# Patient Record
Sex: Female | Born: 1945
Health system: Southern US, Community
[De-identification: ages and names within clinical notes are randomized; demographics above are authoritative.]

## PROBLEM LIST (undated history)

## (undated) DIAGNOSIS — E785 Hyperlipidemia, unspecified: Secondary | ICD-10-CM

## (undated) DIAGNOSIS — K219 Gastro-esophageal reflux disease without esophagitis: Secondary | ICD-10-CM

## (undated) DIAGNOSIS — I10 Essential (primary) hypertension: Secondary | ICD-10-CM

## (undated) DIAGNOSIS — R32 Unspecified urinary incontinence: Secondary | ICD-10-CM

## (undated) DIAGNOSIS — H903 Sensorineural hearing loss, bilateral: Secondary | ICD-10-CM

## (undated) DIAGNOSIS — N952 Postmenopausal atrophic vaginitis: Secondary | ICD-10-CM

## (undated) DIAGNOSIS — M3320 Polymyositis, organ involvement unspecified: Secondary | ICD-10-CM

## (undated) DIAGNOSIS — M1990 Unspecified osteoarthritis, unspecified site: Secondary | ICD-10-CM

## (undated) DIAGNOSIS — M51369 Other intervertebral disc degeneration, lumbar region without mention of lumbar back pain or lower extremity pain: Secondary | ICD-10-CM

## (undated) DIAGNOSIS — K449 Diaphragmatic hernia without obstruction or gangrene: Secondary | ICD-10-CM

## (undated) DIAGNOSIS — E890 Postprocedural hypothyroidism: Secondary | ICD-10-CM

## (undated) DIAGNOSIS — R413 Other amnesia: Secondary | ICD-10-CM

## (undated) DIAGNOSIS — R42 Dizziness and giddiness: Secondary | ICD-10-CM

## (undated) DIAGNOSIS — R1319 Other dysphagia: Secondary | ICD-10-CM

## (undated) DIAGNOSIS — J329 Chronic sinusitis, unspecified: Secondary | ICD-10-CM

## (undated) DIAGNOSIS — R197 Diarrhea, unspecified: Secondary | ICD-10-CM

## (undated) DIAGNOSIS — I459 Conduction disorder, unspecified: Secondary | ICD-10-CM

## (undated) DIAGNOSIS — I7300 Raynaud's syndrome without gangrene: Secondary | ICD-10-CM

## (undated) DIAGNOSIS — N3281 Overactive bladder: Secondary | ICD-10-CM

## (undated) DIAGNOSIS — R251 Tremor, unspecified: Secondary | ICD-10-CM

## (undated) DIAGNOSIS — K602 Anal fissure, unspecified: Secondary | ICD-10-CM

## (undated) DIAGNOSIS — I441 Atrioventricular block, second degree: Secondary | ICD-10-CM

## (undated) DIAGNOSIS — H8110 Benign paroxysmal vertigo, unspecified ear: Secondary | ICD-10-CM

## (undated) DIAGNOSIS — M5136 Other intervertebral disc degeneration, lumbar region: Secondary | ICD-10-CM

## (undated) DIAGNOSIS — K649 Unspecified hemorrhoids: Secondary | ICD-10-CM

## (undated) HISTORY — DX: Conduction disorder, unspecified: I45.9

## (undated) HISTORY — DX: Postmenopausal atrophic vaginitis: N95.2

## (undated) HISTORY — DX: Other amnesia: R41.3

## (undated) HISTORY — DX: Sensorineural hearing loss, bilateral: H90.3

## (undated) HISTORY — DX: Other intervertebral disc degeneration, lumbar region: M51.36

## (undated) HISTORY — DX: Anal fissure, unspecified: K60.2

## (undated) HISTORY — DX: Essential (primary) hypertension: I10

## (undated) HISTORY — DX: Hyperlipidemia, unspecified: E78.5

## (undated) HISTORY — DX: Dizziness and giddiness: R42

## (undated) HISTORY — DX: Atrioventricular block, second degree: I44.1

## (undated) HISTORY — PX: TUBAL LIGATION: SHX77

## (undated) HISTORY — DX: Raynaud's syndrome without gangrene: I73.00

## (undated) HISTORY — DX: Chronic sinusitis, unspecified: J32.9

## (undated) HISTORY — DX: Tremor, unspecified: R25.1

## (undated) HISTORY — DX: Polymyositis, organ involvement unspecified: M33.20

## (undated) HISTORY — DX: Postprocedural hypothyroidism: E89.0

## (undated) HISTORY — PX: CARPAL TUNNEL RELEASE: SHX101

## (undated) HISTORY — DX: Unspecified osteoarthritis, unspecified site: M19.90

## (undated) HISTORY — DX: Overactive bladder: N32.81

## (undated) HISTORY — DX: Other intervertebral disc degeneration, lumbar region without mention of lumbar back pain or lower extremity pain: M51.369

## (undated) HISTORY — DX: Diaphragmatic hernia without obstruction or gangrene: K44.9

## (undated) HISTORY — PX: THYROIDECTOMY: SHX17

## (undated) HISTORY — PX: CATARACT EXTRACTION, BILATERAL: SHX1313

## (undated) HISTORY — DX: Unspecified hemorrhoids: K64.9

## (undated) HISTORY — DX: Other dysphagia: R13.19

## (undated) HISTORY — DX: Diarrhea, unspecified: R19.7

## (undated) HISTORY — DX: Gastro-esophageal reflux disease without esophagitis: K21.9

## (undated) HISTORY — DX: Unspecified urinary incontinence: R32

## (undated) HISTORY — DX: Benign paroxysmal vertigo, unspecified ear: H81.10

## (undated) HISTORY — PX: TOTAL KNEE ARTHROPLASTY: SHX125

## (undated) HISTORY — PX: BILATERAL OOPHORECTOMY: SHX1221

---

## 2003-10-14 ENCOUNTER — Ambulatory Visit (HOSPITAL_BASED_OUTPATIENT_CLINIC_OR_DEPARTMENT_OTHER): Admission: RE | Admit: 2003-10-14 | Discharge: 2003-10-14 | Payer: Self-pay | Admitting: General Surgery

## 2003-10-14 ENCOUNTER — Encounter (INDEPENDENT_AMBULATORY_CARE_PROVIDER_SITE_OTHER): Payer: Self-pay | Admitting: *Deleted

## 2007-05-22 ENCOUNTER — Inpatient Hospital Stay (HOSPITAL_COMMUNITY): Admission: RE | Admit: 2007-05-22 | Discharge: 2007-05-25 | Payer: Self-pay | Admitting: Orthopedic Surgery

## 2008-02-14 ENCOUNTER — Inpatient Hospital Stay (HOSPITAL_COMMUNITY): Admission: RE | Admit: 2008-02-14 | Discharge: 2008-02-17 | Payer: Self-pay | Admitting: Orthopedic Surgery

## 2009-04-15 IMAGING — CR DG CHEST 2V
2 series · 2 of 2 positions shown · non-contrast
Comparison: none

CLINICAL DATA: Preop for left knee surgery.   
 CHEST ? 2 VIEW:

[w chest pa]
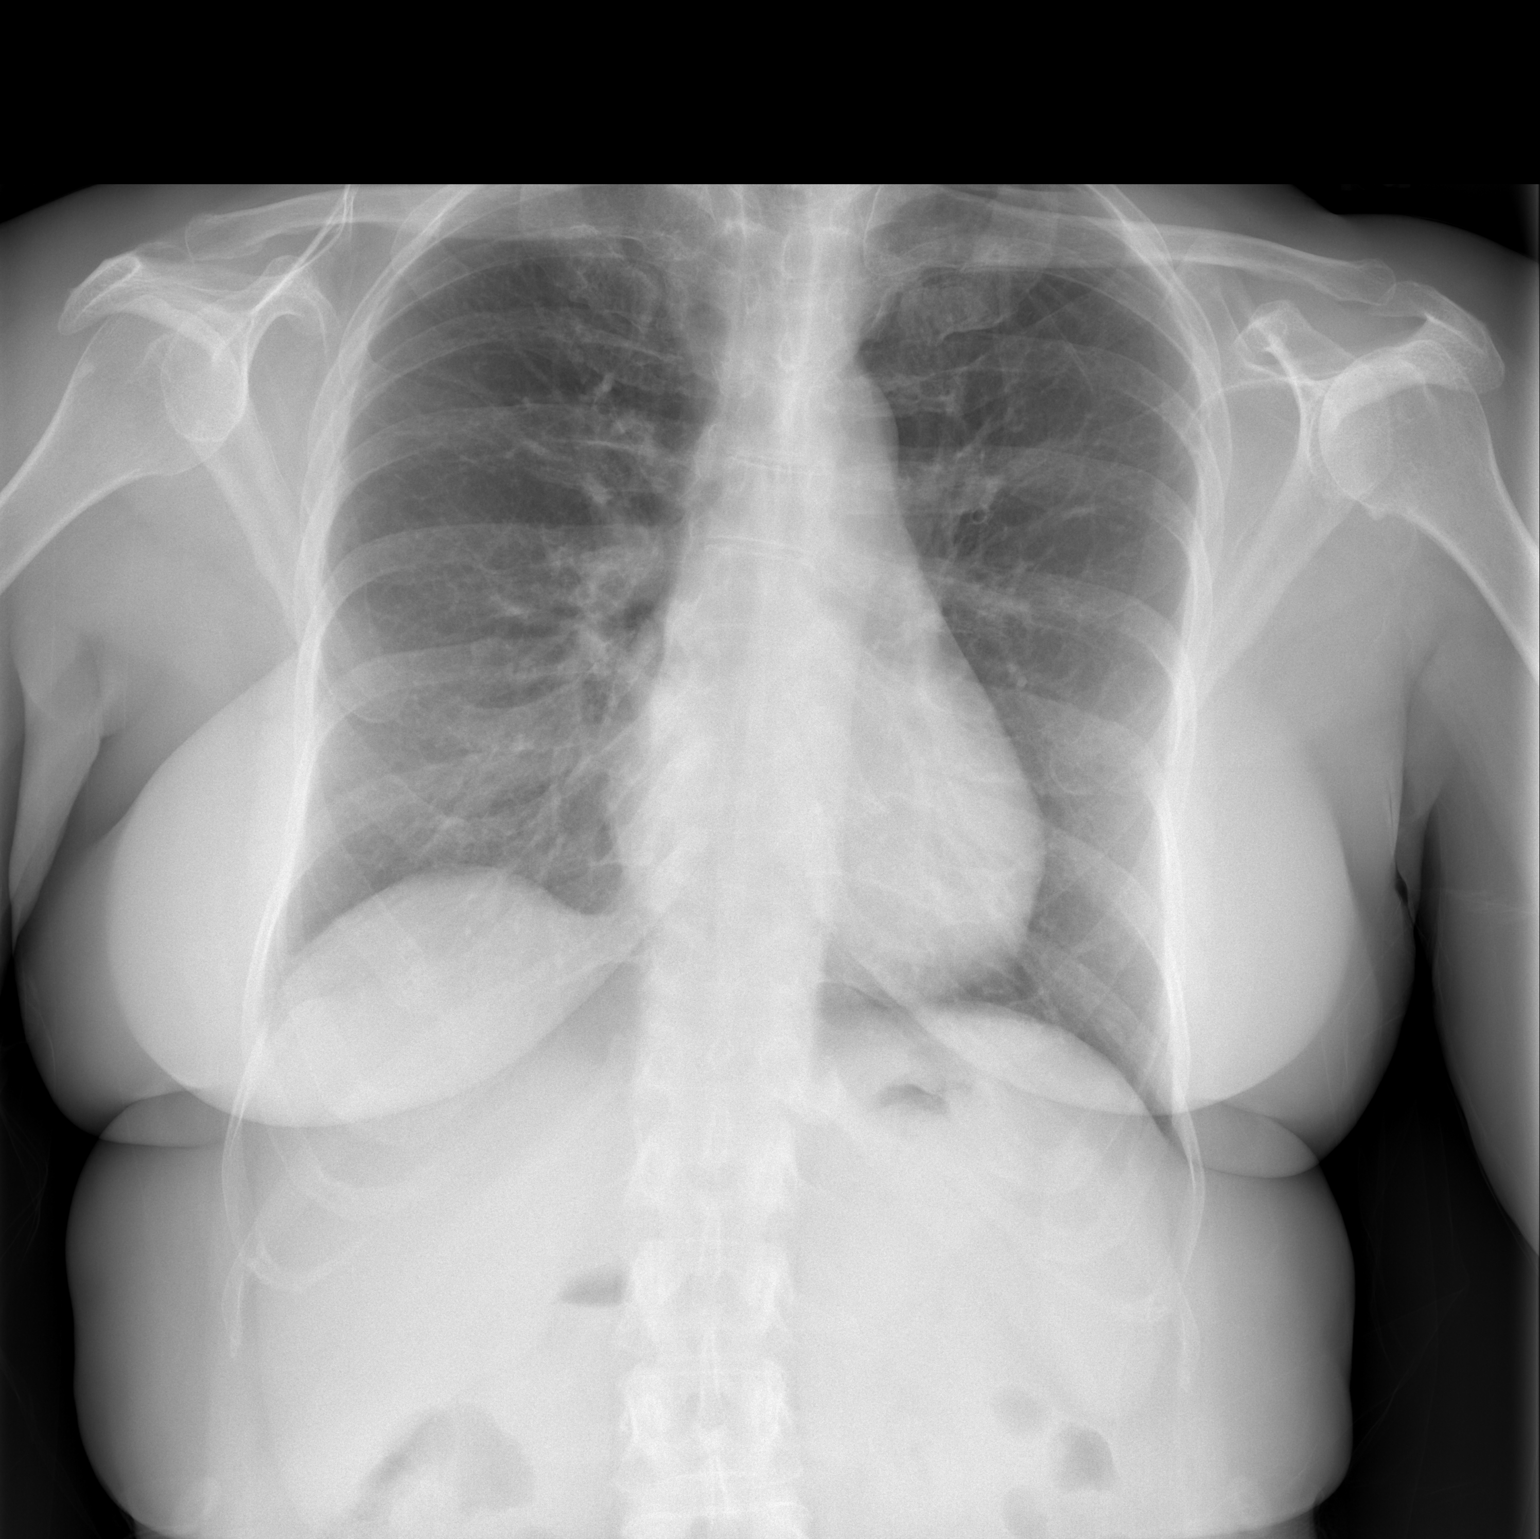

[w chest lat]
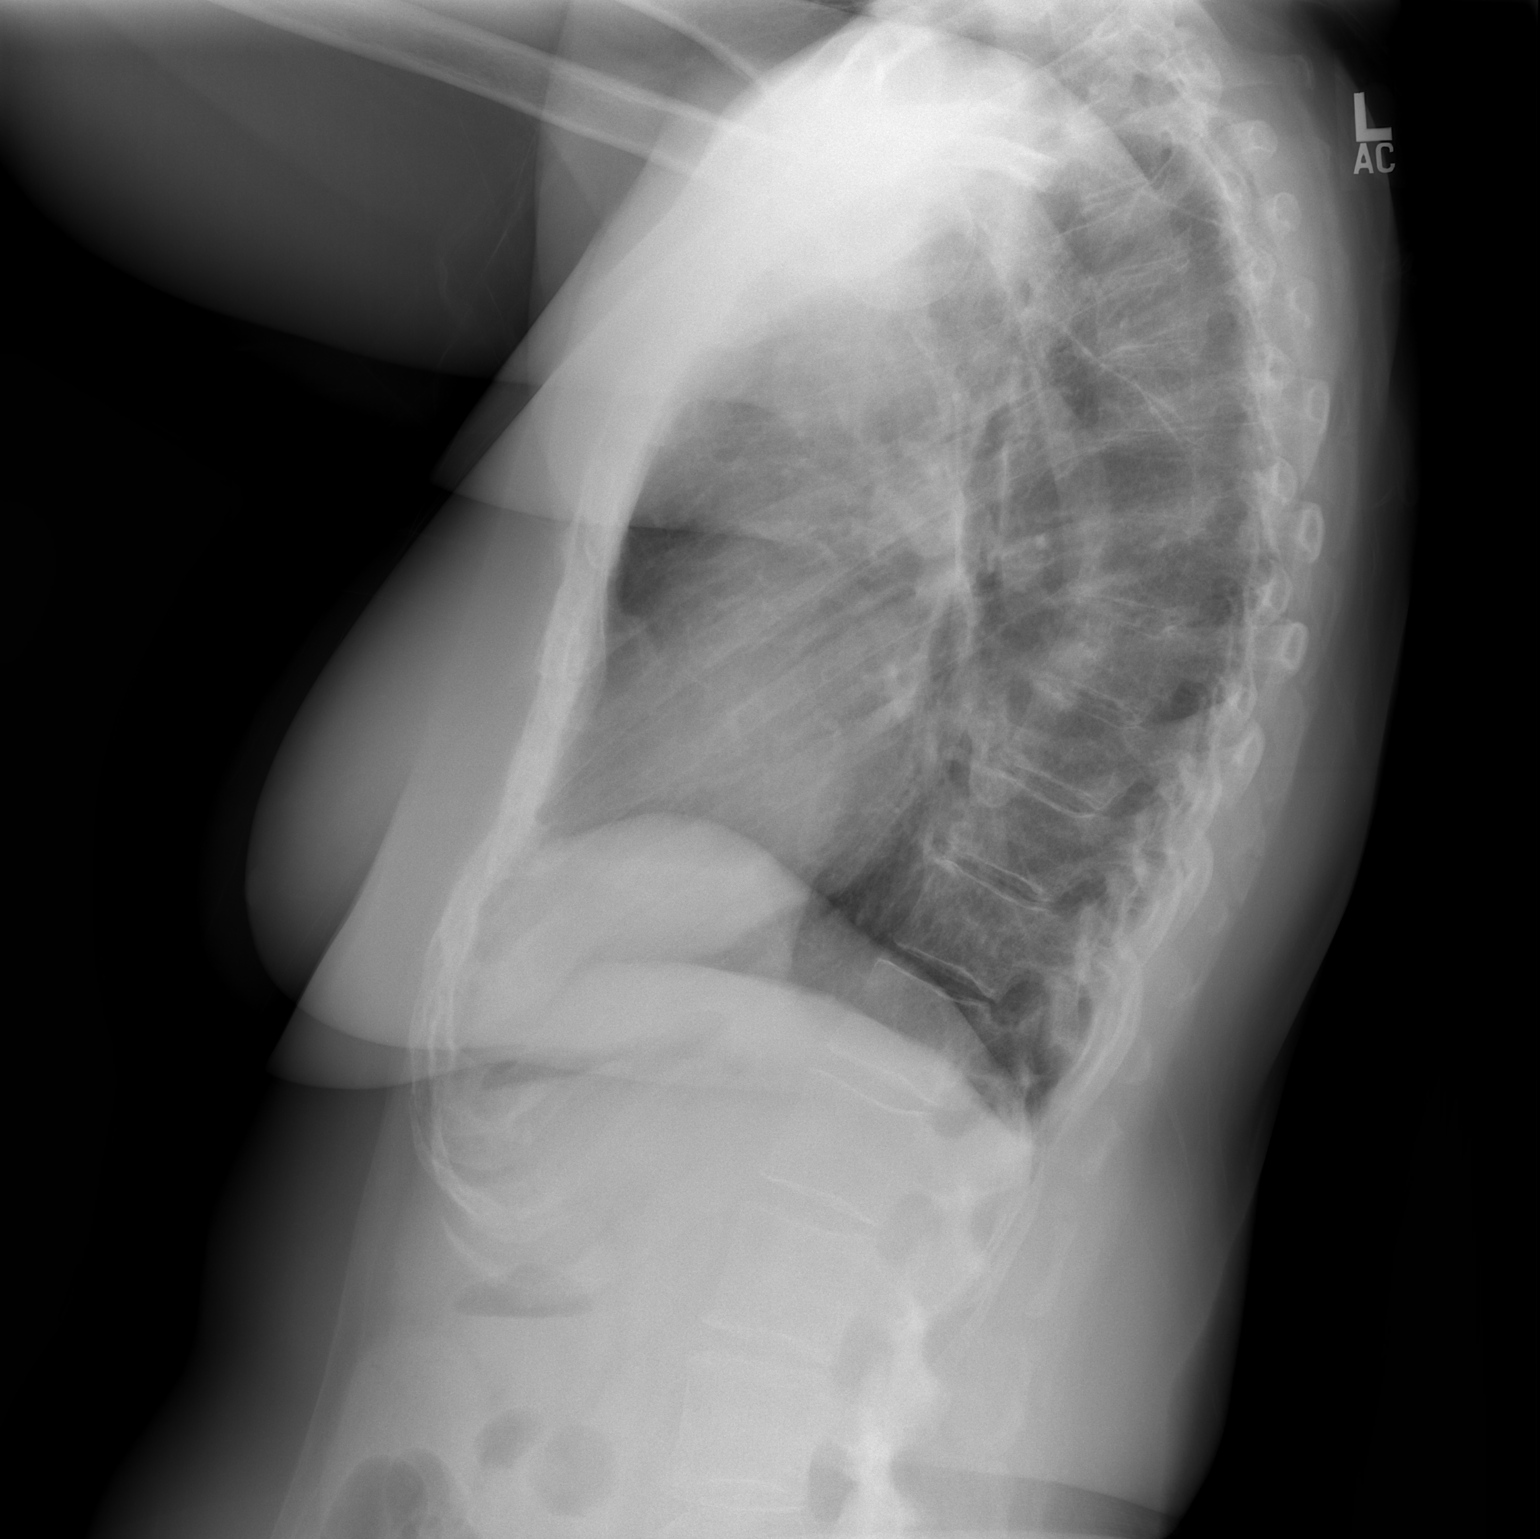

[2 of 2 positions shown; findings below may reference images not displayed]

FINDINGS: Two views of the chest show the lungs to be clear.  The heart is within normal limits in size.  No bony abnormality is noted.
IMPRESSION: No active lung disease.

## 2010-10-20 NOTE — Discharge Summary (Signed)
[name], [name]               ACCOUNT NO.:  [id number]   MEDICAL RECORD NO.:  [id number]          PATIENT TYPE:  INP   LOCATION:  1618                         FACILITY:  [facility]   PHYSICIAN:  Ollen Gross, M.D.    DATE OF BIRTH:  [birth year]   DATE OF ADMISSION:  05/22/2007  DATE OF DISCHARGE:  05/25/2007                               DISCHARGE SUMMARY   ADMISSION DIAGNOSES:  1. Osteoarthritis, left greater than right knee.  2. Hypothyroidism.  3. Hypertension.  4. Hyperlipidemia.  5. Polymyositis.  6. Recent acute sinusitis (treated preoperative).  7. Postmenopausal.   DISCHARGE DIAGNOSES:  1. Osteoarthritis, right knee and left knee, status post left total      knee arthroplasty and cortisone injection, right knee.  2. Mild postoperative blood loss anemia.  3. Mild postoperative hyponatremia, improved.  4. Hypothyroidism.  5. Hypertension.  6. Hyperlipidemia.  7. Polymyositis.  8. Recent acute sinusitis (treated preoperative).  9. Postmenopausal.   PROCEDURE:  Left total knee with cortisone injection in the right knee  on May 22, 2007.  Surgeon:  Dr. Lequita Halt.  Assistant:  Avel Peace  PA-C.  Anesthesia:  Done under spinal with Duramorph.   CONSULTATIONS:  None.   BRIEF HISTORY:  The patient is a 65 year old female with end-stage  arthritis of both knees, left more symptomatic than right, refractory to  nonoperative management, who now presents for a total knee.   LABORATORY DATA:  Preop CBC:  Hemoglobin 13.0, hematocrit 38.1, white  cell count 5.2.  Chem panel:  Slightly elevated sodium of 146.  Remaining Chem panel within normal limits.  PT/INR preop 13.2 and 1.0  with a PTT of 31.  Preop UA negative.  Serial CBCs followed.  Hemoglobin  did drop to 11.2.  last noted H&H 9.9 and 28.7.  Serial pro times  followed.  Last noted PT/INR 22.3 and 1.9.  Serial BMETs were followed.  Sodium did drop down to 134, came back up to 135, last noted at 137.  Remaining  electrolytes remained within normal limits.  EKG August 16, 2006:  Sinus rhythm, normal EKG.   X-RAYS:  Chest x-ray May 16, 2007:  No active lung disease.   HOSPITAL COURSE:  Patient admitted to [facility], tolerated  the procedure well, later transferred to the recovery room and  orthopedic floor.  Started on PCA and p.o. analgesic, given 24 hours of  postop IV antibiotics.  Was nauseated on the evening of surgery.  Was a  little bit better on the morning of day #1.  Decreased her fluids.  She  had good output.  Decreased the fluids once she had good p.o. intake,  started getting up out of bed.  By day #2 she was doing much better.  Nausea had resolved.  She started getting up with therapy, walking short  distances.  I discontinued her fluids and IVs.  Dressing change done.  Incision healing well.  By day #3 she was progressing with her therapy  and tolerating her medications and discharged home.   DISCHARGE PLAN:  1. Discharge May 25, 2007.  2. Discharge diagnoses:  Please see above.  3. Discharge medications:  Coumadin, Percocet, Robaxin.  4. Diet:  Low-sodium, heart-healthy diet.  5. Activity:  Weightbearing as tolerated, left lower extremity, total      knee protocol.  Home health PT and home health nursing.  6. Follow-up:  2 weeks.   DISPOSITION:  Home.   CONDITION ON DISCHARGE:  Improved.      Alexzandrew L. Perkins, P.A.C.      Ollen Gross, M.D.  Electronically Signed    ALP/MEDQ  D:  05/25/2007  T:  05/25/2007  Job:  [id number]

## 2010-10-20 NOTE — Op Note (Signed)
[name], [name]               ACCOUNT NO.:  [id number]   MEDICAL RECORD NO.:  [id number]          PATIENT TYPE:  INP   LOCATION:  0012                         FACILITY:  [facility]   PHYSICIAN:  Ollen Gross, M.D.    DATE OF BIRTH:  1945/12/16   DATE OF PROCEDURE:  02/14/2008  DATE OF DISCHARGE:                               OPERATIVE REPORT   PREOPERATIVE DIAGNOSIS:  Osteoarthritis right knee.   POSTOPERATIVE DIAGNOSIS:  Osteoarthritis right knee.   PROCEDURE:  Right total knee arthroplasty.   SURGEON:  Ollen Gross, M.D.   ASSISTANT:  Alexzandrew L. Perkins, P.A.C.   ANESTHESIA:  Spinal with Duramorph.   ESTIMATED BLOOD LOSS:  Minimal.   DRAINS:  None.   TOURNIQUET TIME:  40 minutes at 300 mmHg.   COMPLICATIONS:  None.   CONDITION:  Stable to recovery.   BRIEF CLINICAL NOTE:  [name] is a 65 year old female with end-stage  arthritis of the right knee with progressively worsening pain and  dysfunction.  She has had a recent successful left total knee  arthroplasty and presents now for right total knee arthroplasty.   PROCEDURE IN DETAIL:  After successful administration of spinal  anesthetic a tourniquet was placed high on the right thigh and right  lower extremity prepped and draped in the usual sterile fashion.  Extremity was wrapped in Esmarch, knee flexed and tourniquet inflated to  300 mmHg.  Midline incision was made with a 10 blade through  subcutaneous tissue to the level of the extensor mechanism.  A fresh  blade is used to make a medial parapatellar arthrotomy.  Soft tissue of  the proximal medial tibia is subperiosteally elevated to the joint line  with the knife and into the semimembranosus bursa with a Cobb elevator.  Soft tissue laterally is elevated with attention being paid to avoid the  patellar tendon on tibial tubercle.  The patella was subluxed laterally  and knee flexed 90 degrees.  ACL and PCL removed.  Drill was used to  create a starting  hole in the distal femur and the canal was thoroughly  irrigated.  A 5 degree right valgus alignment guide is placed and rest  of posterior condyles, rotation is marked and the block pinned to remove  11 mm off the distal femur.  Distal femoral resection is made with an  oscillating saw.  Sizing block was placed.  Size 3 is the most  appropriate.  Rotation is marked off the epicondylar axis.  The size 3  cutting block is placed and the anterior posterior chamfer cuts were  made.   The tibia was subluxed forward and menisci removed.  The extramedullary  tibial alignment guide is placed referencing proximally at the medial  aspect of the tibial tubercle and distally along the second metatarsal  axis and tibial crest.  The block was pinned to remove about 4 mm off  the more deficient lateral side.  Tibial resection is made with an  oscillating saw.  Size 3 is the most appropriate tibial component and  the proximal tibia prepared with a modular drill and keel  punch for a  size 3.  Femoral preparation is completed with intercondylar cut.   Size 3 mobile bearing tibial trial and size 3 posterior stabilized  femoral trial and a 10 mm posterior stabilized rotating platform insert  trial are placed.  With the 10 full extension is achieved with excellent  varus and valgus, anterior and posterior balance throughout full range  of motion.  The patella was everted and this was measured to be 21 mm.  Freehand resection was taken to 12 mm, a 35 template is placed, lug  holes were drilled, trial patella was placed and it tracks normally.  Osteophytes removed off the posterior femur with trial in place.  All  trials are removed and the cut bone surfaces are prepared with pulsatile  lavage.  Cement was mixed and once ready for implantation the size 3  mobile bearing tibial tray, size 3 posterior stabilized femur and 35  patella are cemented into place.  The patella was held with a clamp.  Trial 10 mm  insert was placed, knee held in full extension and all  extruded cement removed.  Once the cement was fully hardened then the  permanent 10 mm posterior stabilized rotating platform insert is placed  in the tibial tray.  The wound was copiously irrigated with saline  solution and FloSeal injected on the posterior capsule, medial and  lateral gutters and suprapatellar area.  A moist sponge is placed and  the tourniquet released with a total time of 40 minutes.  The sponges  held for 2 minutes then removed.  Minimal bleeding is encountered.  That  which was encountered was stopped with electrocautery.  The wound was  again further irrigated with saline and the arthrotomy closed with  interrupted #1 PDS.  Flexion against gravity then 135 degrees.  Subcu  was closed with interrupted 2-0 Vicryl and subcuticular running 4-0  Monocryl.  The incision is cleaned and dried and Steri-Strips and a  bulky sterile dressing are applied.  She then was placed into a knee  immobilizer, awakened and transported to recovery in stable condition.      Ollen Gross, M.D.  Electronically Signed     FA/MEDQ  D:  02/14/2008  T:  02/15/2008  Job:  [id number]

## 2010-10-20 NOTE — Op Note (Signed)
[name], [name]               ACCOUNT NO.:  [id number]   MEDICAL RECORD NO.:  [id number]          PATIENT TYPE:  INP   LOCATION:  0003                         FACILITY:  [facility]   PHYSICIAN:  Ollen Gross, M.D.    DATE OF BIRTH:  1945/09/26   DATE OF PROCEDURE:  05/22/2007  DATE OF DISCHARGE:                               OPERATIVE REPORT   PREOPERATIVE DIAGNOSIS:  1. Osteoarthritis of left knee.  2. Osteoarthritis of the right knee.   POSTOPERATIVE DIAGNOSIS:  1. Osteoarthritis of left knee.  2. Osteoarthritis of the right knee.   PROCEDURE:  1. Left total knee arthroplasty.  2. Cortisone injection, right knee.   SURGEON:  Ollen Gross, M.D.   ASSISTANT:  Alexzandrew L. Perkins, P.A.C.   ANESTHESIA:  Spinal with Duramorph.   ESTIMATED BLOOD LOSS:  Minimal.   DRAINS:  None.   TOURNIQUET TIME:  45 minutes at 300 mmHg.   COMPLICATIONS:  None.   CONDITION.:  Stable to recovery.   CLINICAL NOTE:  [name] is a 65 year old female with end-stage  arthritis of the knees, left more symptomatic than the right, with  progressively worsening pain and dysfunction.  She has failed  nonoperative management and presents now for total knee arthroplasty.   PROCEDURE IN DETAIL:  After the successful administration of spinal  anesthetic, a tourniquet was placed high on the left thigh, and the left  lower extremity prepped and draped in the usual sterile fashion.  The  extremity was wrapped in Esmarch, the knee flexed and a tourniquet  inflated to 300 mmHg.  A midline incision made with a #10 blade through  subcutaneous tissue, to a level of the extensor mechanism.  A fresh  blade was used to make a medial parapatellar arthrotomy.  Soft tissue  over the proximal and medial tibia subperiosteally elevated the joint  line with the knife, and into the semimembranosus bursa with a Cobb  elevator.  Soft tissues laterally were elevated, with attention being  paid to avoid patellar  tendon on tibial tubercle.  The patella was  everted, knee flexed to 90 degrees.  The ACL and PCL were removed.  A  drill was used to create a starting hole in the distal femur, and the  canal was thoroughly irrigated.  The 5 degree left valgus alignment  guide was placed, and referencing of the posterior condyle rotation was  marked on the block, and pinned to remove 10 mm of the distal femur.  Distal femoral resection was made with an oscillating saw.  Sizing block  was placed, and a size 3 is most appropriate.  Rotations was marked at  the epicondylar axis, and a size 3 cutting block was placed.  The  anterior, posterior and chamfer cuts were made.   The tibia was subluxed forward and the menisci are removed.  Extramedullary tibial alignment guide was placed, referencing proximally  at the medial aspect of the tibial tubercle and distally along the  second metatarsal axis of the tibial crest.  The block was pinned to  remove about 4 mm off the more deficient  medial side.  Tibial resection  was made with an oscillating saw.  A size 2.5 was the most appropriate  tibial component, and the proximal tibia was prepared with the modular  drill and keel punch for a size 2.5.  Femoral preparation was completed  with the intercondylar cut for the size 3.   A size 2.5 mobile bearing tibial trial, size 3 posterior stabilized  femoral trial, and a 10 mm posterior stabilized rotating platform insert  trial were placed.  With the 10 full extension was achieved, with  excellent varus and valgus balance throughout the full range of motion.  The patella was then everted and thickness measured to be 24 mm.  Freehand resection was taken to 14 mm, a 35 template was placed.  Lug  holes were drilled.  A trial patella was placed and it tracks normally.  Osteophytes were removed off the posterior femur with the trial in  place.  All trials were removed and the cut bone surfaces were prepared  with pulsatile  lavage.  Cement was mixed, and once ready for  implantation, a size 2.5 mobile bearing tibial tray, a size 3 posterior  stabilized femur and a 35 patella were cemented in place.  The patella  was held with the clamp.  A trial 10-mm insert was placed, and the knee  held in full extension; all extruded cement was removed.  When the  cement was fully hardened, then the wound was copiously irrigated with  saline solution.  The trial was  removed and FloSeal injected on the  posterior capsule.  A permanent 10 mm posterior stabilized rotating  platform insert was placed into the tibial tray.  The FloSeal was then  injected into the medial and lateral gutters and the suprapatellar area.  The tourniquet was released, with a total time of 45 minutes.  A moist  sponge was placed and the tourniquet was released.  Sponge was held for  about 2 minutes and removed.  Minimal bleeding was encountered.  That  which was encountered was stopped with electrocautery.  The wound was  then further irrigated and the extensor mechanism was then closed with  interrupted #1 PDS.  Flexion against gravity was 135 degrees.  The  subcutaneous was closed with interrupted 2-0 Vicryl and subcuticular  running 4-0 Monocryl.  The incision was cleaned and dried, and Steri-  Strips and a bulky sterile dressing were applied.  She was then placed  into a knee immobilizer.  I then prepped the right knee and injected  with lidocaine and Depo-Medrol with no problems.  She was then awakened  and transported to recovery in stable condition.      Ollen Gross, M.D.  Electronically Signed     FA/MEDQ  D:  05/22/2007  T:  05/22/2007  Job:  [id number]

## 2010-10-20 NOTE — H&P (Signed)
[name], [name]               ACCOUNT NO.:  [id number]   MEDICAL RECORD NO.:  [id number]          PATIENT TYPE:  INP   LOCATION:  0003                         FACILITY:  [facility]   PHYSICIAN:  Ollen Gross, M.D.    DATE OF BIRTH:  [birth year]   DATE OF ADMISSION:  05/22/2007  DATE OF DISCHARGE:                              HISTORY & PHYSICAL   CHIEF COMPLAINT:  Left knee pain.   HISTORY OF PRESENT ILLNESS:  The patient is a 65 year old female who has  been seen by Dr. Lequita Halt for bilateral knee pain, left greater than  right.  It has been ongoing for several years now.  She had a knee scope  in [place], [place], about eight years ago with some temporary  relief.  Unfortunately over time, her knees have become more painful and  problematic.  She is seen in the office where x-rays show the right knee  is just about bone-on-bone, left knee has end-stage disease and gone  into a little bit of valgus.  It is felt she has reached a point where  she could benefit from undergoing surgical intervention.  Risks and  benefits discussed.  The patient is subsequently admitted to the  hospital.   ALLERGIES:  NO KNOWN DRUG ALLERGIES.   CURRENT MEDICATIONS:  Methotrexate, levothyroxine, folic acid, atenolol,  ibuprofen, Extra Strength Tylenol, fiber laxative, One A Day vitamins,  Calcium plus D.   PAST MEDICAL HISTORY:  1. Hypertension.  2. Hypothyroidism.  3. Hyperlipidemia.  4. Polymyositis.  5. Osteoarthritis.  6. Recent acute sinusitis.  7. Postmenopausal.   PAST SURGICAL HISTORY:  1. Thyroid surgery.  2. Right knee scope.  3. Bilateral carpal tunnel releases.   FAMILY HISTORY:  Father deceased in his 30s, mother deceased at 78 with  Alzheimer's, pneumonia.   SOCIAL HISTORY:  Married, homemaker, nonsmoker.  No alcohol.  Three  children.  One of her daughters is a Engineer, civil (consulting).   REVIEW OF SYSTEMS:  GENERAL:  No fevers, chills, night sweats.  NEURO:  No seizures, syncope or  paralysis.  RESPIRATORY:  No shortness of  breath, productive cough or hemoptysis.  CARDIOVASCULAR:  No chest pain,  angina or orthopnea.  GI: No nausea, vomiting, diarrhea, constipation.  GU: No dysuria, hematuria or discharge.  MUSCULOSKELETAL: Left knee.   PHYSICAL EXAMINATION:  VITAL SIGNS:  Pulse 60, respirations 12, blood  pressure 160/82.  GENERAL:  A 65 year old white female well-nourished, well-developed, hip  and thigh obesity, slightly overweight.  Noted to wear glasses,  accompanied by her daughter.  HEENT:  Normocephalic, atraumatic.  Pupils round and reactive.  Noted to  wear glasses.  EOMs intact.  NECK:  Supple.  CHEST:  Clear anterior and posterior chest walls.  No rhonchi, rales or  wheezing.  HEART:  Regular rate and rhythm.  No murmur, S1-S2 noted.  ABDOMEN:  Soft, round.  Bowel sounds present.  RECTAL/BREASTS/GENITALIA:  Not done, not pertinent to present illness.  EXTREMITIES:  Left knee:  Left knee range of motion of 5-115, marked  crepitus, tender more lateral than medial.  No instability.  Right  knee:  0-120, marked crepitus, tender more medial than lateral.  No  instability.   IMPRESSION:  1. Osteoarthritis, left knee greater than right knee.  2. Hypothyroidism.  3. Hypertension  4. Hyperlipidemia.  5. Polymyositis.  6. Recent acute sinusitis (treated preoperatively).  7. Postmenopausal.   PLAN:  The patient admitted to [facility] to undergo a left  total knee replacement arthroplasty.  Surgery will be performed by Dr.  Ollen Gross.      Alexzandrew L. Perkins, P.A.C.      Ollen Gross, M.D.  Electronically Signed    ALP/MEDQ  D:  05/21/2007  T:  05/22/2007  Job:  [id number]   cc:   Gladstone Lighter [id number], M.D.  [facility]  [address]  [place], [place] [address]

## 2010-10-23 NOTE — Op Note (Signed)
NAME:  [name], [name]                         ACCOUNT NO.:  [id number]   MEDICAL RECORD NO.:  [id number]                   PATIENT TYPE:  AMB   LOCATION:  DSC                                  FACILITY:  [facility]   PHYSICIAN:  Gita Kudo, M.D.              DATE OF BIRTH:  01-18-1946   DATE OF PROCEDURE:  10/14/2003  DATE OF DISCHARGE:                                 OPERATIVE REPORT   OPERATIVE PROCEDURE:  Left deltoid muscle biopsy.   SURGEON:  Gita Kudo, M.D.   ANESTHESIA:  1% local Xylocaine.   CLINICAL SUMMARY:  A 65 year old female with possible myositis brought in  for elective muscle biopsy.   DESCRIPTION OF PROCEDURE:  She was positioned, prepped, and draped in the  standard fashion.  Infiltrated with Xylocaine.  A vertical incision made  centered over the upper deltoid and self-retaining retractor placed.  The  fascia was opened and the deltoid muscle grasped with an Allis clamp.  A  large segment was removed for good pathologic diagnosis.  Following this,  the wound was closed with figure-of-eight 3-0 Vicryl for the fascia,  interrupted 4-0 nylon for skin.  Sterile absorbent dressings were applied.  The patient tolerated the procedure well and will be discharged and be  followed as an outpatient.                                               Gita Kudo, M.D.    MRL/MEDQ  D:  10/14/2003  T:  10/14/2003  Job:  [id number]

## 2010-10-23 NOTE — Discharge Summary (Signed)
[name], [name]               ACCOUNT NO.:  [id number]   MEDICAL RECORD NO.:  [id number]          PATIENT TYPE:  INP   LOCATION:  1618                         FACILITY:  [facility]   PHYSICIAN:  Ollen Gross, M.D.    DATE OF BIRTH:  08-11-1945   DATE OF ADMISSION:  02/14/2008  DATE OF DISCHARGE:  02/17/2008                               DISCHARGE SUMMARY   ADMISSION DIAGNOSES:  1. Osteoarthritis, left knee.  2. History of bronchitis.  3. Hypertension.  4. Postmenopausal.  5. Hypothyroidism.  6. Hyperlipidemia.  7. History of polymyositis.   DISCHARGE DIAGNOSES:  1. Osteoarthritis, right knee, status post right total knee      replacement arthroplasty.  2. Mild postop blood loss anemia.  Did not require transfusion.  3. History of bronchitis.  4. Hypertension.  5. Postmenopausal.  6. Hypothyroidism.  7. Hyperlipidemia.  8. History of polymyositis.   PROCEDURE:  February 14, 2007, right total knee.   SURGEON:  Dr. Lequita Halt.   ASSISTANT:  Alexzandrew L. Perkins, PA-C.   ANESTHESIA:  Spinal.   CONSULTATIONS:  None.   BRIEF HISTORY:  Ms. Hitchman is a 64 year old female with end-stage  arthritis of the right knee with progressive worsening pain and  dysfunction who has had successful left total knee, now presents for  right total knee.   LABORATORY DATA:  Preop CBC showed a hemoglobin of 13.0, hematocrit  40.4, white cell count 5.1, platelets 197.  Postop hemoglobin 10.3, came  back up to 10.5 last night.  H and H 9.7 and 29.7.  PT/PTT preop 13.4  and 33 respectively.  INR 1.0.  Serial pro times followed, last noted  PT/INR 20.5 and 1.7.  Chem panel on admission all within normal limits.  Serial BMETs were followed.  Electrolytes remained within normal limits.  Glucose did go up from 75 to 157 and was coming back down to 148.  Preop  UA was negative.  Blood group type A negative.  EKG faxed over dated  August 16, 2007, normal EKG confirmed but unable to read  signature.   HOSPITAL COURSE:  The patient was admitted to [facility].  Tolerated procedure well.  Later transferred from the recovery room to  the orthopedic floor.  Started on PCA and p.o. analgesic pain control  following surgery.  She was able to get a little bit of rest off and on  through the night.  Was doing pretty well on the morning of day one.  Blood pressure was up a little bit.  They started her back on her  atenolol, encouraged pulmonary toilet due to her past history of recent  bronchitis.  Hemoglobin was stable.  Started getting up out of bed.  Actually did very well on day one, walked about 50 feet.  By day 2  continued to progress well.  Dressing was changed.  Incision looked  good.  Pain was under better control.  Hemoglobin was stable at 10.  Continued to do well with therapy, and by the morning of February 17, 2008, she was tolerating her meds and discharged home.  DISCHARGE PLAN:  1. The patient was discharged home on February 17, 2008.  2. Discharge diagnoses:  Please see above.  3. Discharge medications:  Percocet, Robaxin, Coumadin.   DIET:  Heart-healthy diet.   ACTIVITY:  Weightbearing as tolerated.  Total knee protocol.  Home  health PT, home health nursing.   FOLLOWUP:  Two weeks.   DISPOSITION:  Home.   CONDITION ON DISCHARGE:  Improving.      Alexzandrew L. Perkins, P.A.C.      Ollen Gross, M.D.  Electronically Signed    ALP/MEDQ  D:  03/19/2008  T:  03/19/2008  Job:  [id number]   cc:   Lemmie Evens, M.D.  Fax: [phone]   Dr. Mariane Baumgarten

## 2010-10-23 NOTE — H&P (Signed)
[name], [name]               ACCOUNT NO.:  [id number]   MEDICAL RECORD NO.:  [id number]          PATIENT TYPE:  INP   LOCATION:  NA                           FACILITY:  [facility]   PHYSICIAN:  Ollen Gross, M.D.    DATE OF BIRTH:  1945/09/24   DATE OF ADMISSION:  02/14/2008  DATE OF DISCHARGE:                              HISTORY & PHYSICAL   CHIEF COMPLAINT:  Right knee pain.   HISTORY OF PRESENT ILLNESS:  The patient is a 65 year old female who has  been seen by Dr. Lequita Halt for ongoing knee pain.  She has undergone a  left total knee in the past.  Unfortunately the right knee continues to  be problematic.  She is noted to have tricompartmental arthritis with  bone-on-bone throughout.  Is felt to be a good candidate.  Risks and  benefits have  been discussed, and she elected to proceed with surgery.   ALLERGIES:  No known drug allergies.   CURRENT MEDICATIONS:  Folic acid, methotrexate, levothyroxine, atenolol,  ibuprofen, Tylenol, FiberCon, Women's One A Day, calcium.   PAST MEDICAL HISTORY:  1. History of bronchitis.  2. Hypertension.  3. Postmenopausal.  4. Hypothyroidism.  5. Hyperlipidemia.  6. History of polymyositis.   PAST SURGICAL HISTORY:  1. Left knee surgery.  2. Thyroid surgery.  3. Bilateral carpal tunnel surgery.   SOCIAL HISTORY:  Married, nonsmoker.  No alcohol.  Three children.  Husband and children will be assisting with care after surgery.   FAMILY HISTORY:  Father deceased at age 35.  Mother deceased at age 46.  She has 1 sibling.   REVIEW OF SYSTEMS:  GENERAL:  No fever, chills or night sweats.  NEUROLOGICAL:  No seizures, syncope or paralysis.  RESPIRATORY:  No  shortness of breath, productive cough or hemoptysis.  CARDIOVASCULAR:  No chest pain, angina, orthopnea.  GI:  No nausea, vomiting, diarrhea or  constipation.  GU:  No dysuria, hematuria or discharge.  MUSCULOSKELETAL:  Joint pain.   PHYSICAL EXAMINATION:  VITAL SIGNS:  Pulse 64,  respirations 12, blood  pressure 138/88.  GENERAL:  A 62-year white female well-nourished, well-developed, hip and  thigh obesity, slightly overweight, no acute distress, alert, oriented,  and cooperative.  HEENT:  Normocephalic, atraumatic.  Pupils are round and reactive.  Oropharynx clear.  EOMs intact.  NECK:  Supple.  CHEST:  Clear.  HEART:  Regular rate and rhythm.  No murmur.  ABDOMEN:  Soft, slightly round.  Bowel sounds present.  RECTAL/BREASTS/GENITALIA:  Not done and not pertinent to present  illness.  EXTREMITIES:  Right knee range of motion 5-120.  No instability. She has  no effusion.   IMPRESSION:  Osteoarthritis right knee.   PLAN:  The patient admitted to [facility] to undergo a right  total knee replacement arthroplasty.  Surgery will be performed by Dr.  Ollen Gross.      Alexzandrew L. Perkins, P.A.C.      Ollen Gross, M.D.  Electronically Signed    ALP/MEDQ  D:  02/13/2008  T:  02/14/2008  Job:  [id number]   cc:  Lemmie Evens, M.D.  Fax: [phone]   Greg Dr. Tomasa Blase

## 2011-03-10 LAB — BASIC METABOLIC PANEL
BUN: 4 — ABNORMAL LOW
CO2: 28
Calcium: 8.1 — ABNORMAL LOW
Calcium: 8.3 — ABNORMAL LOW
Chloride: 105
Creatinine, Ser: 0.49
GFR calc Af Amer: >60
GFR calc non Af Amer: >60
Potassium: 4
Sodium: 136

## 2011-03-10 LAB — COMPREHENSIVE METABOLIC PANEL
Alkaline Phosphatase: 59
CO2: 28
Chloride: 109
Creatinine, Ser: 0.54
Potassium: 4.2
Sodium: 141

## 2011-03-10 LAB — CBC
HCT: 31.8 — ABNORMAL LOW
HCT: 40.4
Hemoglobin: 10.3 — ABNORMAL LOW
Hemoglobin: 10.5 — ABNORMAL LOW
Hemoglobin: 9.7 — ABNORMAL LOW
MCHC: 32.9
MCHC: 33
MCV: 87.9
MCV: 88.6
Platelets: 132 — ABNORMAL LOW
Platelets: 197
RDW: 17.4 — ABNORMAL HIGH
RDW: 17.6 — ABNORMAL HIGH
WBC: 5.1
WBC: 6.2
WBC: 8.5

## 2011-03-10 LAB — URINALYSIS, ROUTINE W REFLEX MICROSCOPIC
Bilirubin Urine: NEGATIVE
Hgb urine dipstick: NEGATIVE
Ketones, ur: NEGATIVE
Protein, ur: NEGATIVE

## 2011-03-10 LAB — PROTIME-INR
INR: 1
INR: 1.1
INR: 1.3
INR: 1.7 — ABNORMAL HIGH
Prothrombin Time: 13.4
Prothrombin Time: 17 — ABNORMAL HIGH
Prothrombin Time: 20.5 — ABNORMAL HIGH

## 2011-03-10 LAB — TYPE AND SCREEN
ABO/RH(D): A NEG
Antibody Screen: NEGATIVE

## 2011-03-12 LAB — BASIC METABOLIC PANEL
BUN: 10
CO2: 28
Calcium: 8.3 — ABNORMAL LOW
Chloride: 101
Chloride: 102
Creatinine, Ser: 0.55
GFR calc non Af Amer: >60
GFR calc non Af Amer: >60
GFR calc non Af Amer: >60
Potassium: 4.3
Potassium: 4.4
Sodium: 134 — ABNORMAL LOW
Sodium: 135
Sodium: 137

## 2011-03-12 LAB — CBC
HCT: 28.7 — ABNORMAL LOW
HCT: 31.1 — ABNORMAL LOW
HCT: 32 — ABNORMAL LOW
Hemoglobin: 10.6 — ABNORMAL LOW
Hemoglobin: 11.2 — ABNORMAL LOW
MCHC: 34.2
MCHC: 34.4
MCHC: 34.9
MCV: 88.9
MCV: 89.4
Platelets: 167
Platelets: 173
Platelets: 201
RBC: 3.47 — ABNORMAL LOW
RBC: 3.6 — ABNORMAL LOW
RDW: 15.4
RDW: 15.9 — ABNORMAL HIGH
WBC: 8.6
WBC: 8.7

## 2011-03-12 LAB — TYPE AND SCREEN
ABO/RH(D): A NEG
Antibody Screen: NEGATIVE

## 2011-03-12 LAB — PROTIME-INR
INR: 1.9 — ABNORMAL HIGH
Prothrombin Time: 13.1

## 2011-03-12 LAB — ABO/RH: ABO/RH(D): A NEG

## 2011-03-15 LAB — URINALYSIS, ROUTINE W REFLEX MICROSCOPIC
Bilirubin Urine: NEGATIVE
Glucose, UA: NEGATIVE
Hgb urine dipstick: NEGATIVE
Ketones, ur: NEGATIVE
Nitrite: NEGATIVE
Protein, ur: NEGATIVE
Specific Gravity, Urine: 1.021
Urobilinogen, UA: 0.2
pH: 6

## 2011-03-15 LAB — COMPREHENSIVE METABOLIC PANEL
ALT: 25
AST: 30
Albumin: 3.5
Alkaline Phosphatase: 65
BUN: 7
CO2: 30
Calcium: 9.4
Chloride: 108
Creatinine, Ser: 0.67
GFR calc Af Amer: >60
GFR calc non Af Amer: >60
Glucose, Bld: 100 — ABNORMAL HIGH
Potassium: 4.2
Sodium: 146 — ABNORMAL HIGH
Total Bilirubin: 1
Total Protein: 5.9 — ABNORMAL LOW

## 2011-03-15 LAB — CBC
HCT: 38.1
Hemoglobin: 13
MCHC: 34.1
MCV: 88.6
Platelets: 241
RBC: 4.3
RDW: 15.1
WBC: 5.2

## 2011-03-15 LAB — PROTIME-INR
INR: 1
Prothrombin Time: 13.2

## 2011-03-15 LAB — APTT: aPTT: 31

## 2011-06-10 DIAGNOSIS — M1710 Unilateral primary osteoarthritis, unspecified knee: Secondary | ICD-10-CM | POA: Diagnosis not present

## 2011-06-16 DIAGNOSIS — Z6834 Body mass index (BMI) 34.0-34.9, adult: Secondary | ICD-10-CM | POA: Diagnosis not present

## 2011-06-16 DIAGNOSIS — Z79899 Other long term (current) drug therapy: Secondary | ICD-10-CM | POA: Diagnosis not present

## 2011-06-16 DIAGNOSIS — I10 Essential (primary) hypertension: Secondary | ICD-10-CM | POA: Diagnosis not present

## 2011-06-16 DIAGNOSIS — E039 Hypothyroidism, unspecified: Secondary | ICD-10-CM | POA: Diagnosis not present

## 2011-06-16 DIAGNOSIS — M3320 Polymyositis, organ involvement unspecified: Secondary | ICD-10-CM | POA: Diagnosis not present

## 2011-08-03 DIAGNOSIS — M3320 Polymyositis, organ involvement unspecified: Secondary | ICD-10-CM | POA: Diagnosis not present

## 2011-08-03 DIAGNOSIS — M159 Polyosteoarthritis, unspecified: Secondary | ICD-10-CM | POA: Diagnosis not present

## 2011-09-29 DIAGNOSIS — Z79899 Other long term (current) drug therapy: Secondary | ICD-10-CM | POA: Diagnosis not present

## 2011-11-30 DIAGNOSIS — M3320 Polymyositis, organ involvement unspecified: Secondary | ICD-10-CM | POA: Diagnosis not present

## 2011-11-30 DIAGNOSIS — M159 Polyosteoarthritis, unspecified: Secondary | ICD-10-CM | POA: Diagnosis not present

## 2011-12-20 DIAGNOSIS — E039 Hypothyroidism, unspecified: Secondary | ICD-10-CM | POA: Diagnosis not present

## 2011-12-20 DIAGNOSIS — M3320 Polymyositis, organ involvement unspecified: Secondary | ICD-10-CM | POA: Diagnosis not present

## 2011-12-20 DIAGNOSIS — Z1231 Encounter for screening mammogram for malignant neoplasm of breast: Secondary | ICD-10-CM | POA: Diagnosis not present

## 2011-12-20 DIAGNOSIS — Z6833 Body mass index (BMI) 33.0-33.9, adult: Secondary | ICD-10-CM | POA: Diagnosis not present

## 2011-12-20 DIAGNOSIS — I10 Essential (primary) hypertension: Secondary | ICD-10-CM | POA: Diagnosis not present

## 2011-12-27 DIAGNOSIS — K921 Melena: Secondary | ICD-10-CM | POA: Diagnosis not present

## 2012-01-04 DIAGNOSIS — I10 Essential (primary) hypertension: Secondary | ICD-10-CM | POA: Diagnosis not present

## 2012-01-04 DIAGNOSIS — K921 Melena: Secondary | ICD-10-CM | POA: Diagnosis not present

## 2012-01-04 DIAGNOSIS — Z7982 Long term (current) use of aspirin: Secondary | ICD-10-CM | POA: Diagnosis not present

## 2012-01-04 DIAGNOSIS — D129 Benign neoplasm of anus and anal canal: Secondary | ICD-10-CM | POA: Diagnosis not present

## 2012-01-04 DIAGNOSIS — Z79899 Other long term (current) drug therapy: Secondary | ICD-10-CM | POA: Diagnosis not present

## 2012-01-04 DIAGNOSIS — D128 Benign neoplasm of rectum: Secondary | ICD-10-CM | POA: Diagnosis not present

## 2012-01-04 HISTORY — PX: COLONOSCOPY: SHX174

## 2012-02-15 DIAGNOSIS — I10 Essential (primary) hypertension: Secondary | ICD-10-CM | POA: Diagnosis not present

## 2012-02-15 DIAGNOSIS — R209 Unspecified disturbances of skin sensation: Secondary | ICD-10-CM | POA: Diagnosis not present

## 2012-02-15 DIAGNOSIS — E039 Hypothyroidism, unspecified: Secondary | ICD-10-CM | POA: Diagnosis not present

## 2012-02-15 DIAGNOSIS — R079 Chest pain, unspecified: Secondary | ICD-10-CM | POA: Diagnosis not present

## 2012-02-15 DIAGNOSIS — R404 Transient alteration of awareness: Secondary | ICD-10-CM | POA: Diagnosis not present

## 2012-02-15 DIAGNOSIS — H8110 Benign paroxysmal vertigo, unspecified ear: Secondary | ICD-10-CM | POA: Diagnosis not present

## 2012-02-15 DIAGNOSIS — R112 Nausea with vomiting, unspecified: Secondary | ICD-10-CM | POA: Diagnosis not present

## 2012-02-15 DIAGNOSIS — R42 Dizziness and giddiness: Secondary | ICD-10-CM | POA: Diagnosis not present

## 2012-02-16 DIAGNOSIS — K591 Functional diarrhea: Secondary | ICD-10-CM | POA: Diagnosis not present

## 2012-03-16 DIAGNOSIS — K921 Melena: Secondary | ICD-10-CM | POA: Diagnosis not present

## 2012-03-31 DIAGNOSIS — Z23 Encounter for immunization: Secondary | ICD-10-CM | POA: Diagnosis not present

## 2012-03-31 DIAGNOSIS — M3320 Polymyositis, organ involvement unspecified: Secondary | ICD-10-CM | POA: Diagnosis not present

## 2012-03-31 DIAGNOSIS — M159 Polyosteoarthritis, unspecified: Secondary | ICD-10-CM | POA: Diagnosis not present

## 2012-04-14 DIAGNOSIS — H8109 Meniere's disease, unspecified ear: Secondary | ICD-10-CM | POA: Diagnosis not present

## 2012-04-14 DIAGNOSIS — R42 Dizziness and giddiness: Secondary | ICD-10-CM | POA: Diagnosis not present

## 2012-04-14 DIAGNOSIS — H9319 Tinnitus, unspecified ear: Secondary | ICD-10-CM | POA: Diagnosis not present

## 2012-04-26 DIAGNOSIS — Z6833 Body mass index (BMI) 33.0-33.9, adult: Secondary | ICD-10-CM | POA: Diagnosis not present

## 2012-04-26 DIAGNOSIS — B029 Zoster without complications: Secondary | ICD-10-CM | POA: Diagnosis not present

## 2012-05-02 DIAGNOSIS — H905 Unspecified sensorineural hearing loss: Secondary | ICD-10-CM | POA: Diagnosis not present

## 2012-05-02 DIAGNOSIS — H903 Sensorineural hearing loss, bilateral: Secondary | ICD-10-CM | POA: Diagnosis not present

## 2012-05-02 DIAGNOSIS — H8110 Benign paroxysmal vertigo, unspecified ear: Secondary | ICD-10-CM | POA: Diagnosis not present

## 2012-05-02 DIAGNOSIS — H9319 Tinnitus, unspecified ear: Secondary | ICD-10-CM | POA: Diagnosis not present

## 2012-05-25 DIAGNOSIS — Z79899 Other long term (current) drug therapy: Secondary | ICD-10-CM | POA: Diagnosis not present

## 2012-05-25 DIAGNOSIS — E039 Hypothyroidism, unspecified: Secondary | ICD-10-CM | POA: Diagnosis not present

## 2012-06-14 DIAGNOSIS — K921 Melena: Secondary | ICD-10-CM | POA: Diagnosis not present

## 2012-06-14 DIAGNOSIS — H8110 Benign paroxysmal vertigo, unspecified ear: Secondary | ICD-10-CM | POA: Diagnosis not present

## 2012-06-23 DIAGNOSIS — R42 Dizziness and giddiness: Secondary | ICD-10-CM | POA: Diagnosis not present

## 2012-06-23 DIAGNOSIS — E039 Hypothyroidism, unspecified: Secondary | ICD-10-CM | POA: Diagnosis not present

## 2012-06-23 DIAGNOSIS — I10 Essential (primary) hypertension: Secondary | ICD-10-CM | POA: Diagnosis not present

## 2012-06-23 DIAGNOSIS — E785 Hyperlipidemia, unspecified: Secondary | ICD-10-CM | POA: Diagnosis not present

## 2012-06-23 DIAGNOSIS — Z6832 Body mass index (BMI) 32.0-32.9, adult: Secondary | ICD-10-CM | POA: Diagnosis not present

## 2012-06-28 DIAGNOSIS — H52229 Regular astigmatism, unspecified eye: Secondary | ICD-10-CM | POA: Diagnosis not present

## 2012-06-28 DIAGNOSIS — H5200 Hypermetropia, unspecified eye: Secondary | ICD-10-CM | POA: Diagnosis not present

## 2012-06-28 DIAGNOSIS — H43399 Other vitreous opacities, unspecified eye: Secondary | ICD-10-CM | POA: Diagnosis not present

## 2012-06-28 DIAGNOSIS — H2510 Age-related nuclear cataract, unspecified eye: Secondary | ICD-10-CM | POA: Diagnosis not present

## 2012-08-01 DIAGNOSIS — M3320 Polymyositis, organ involvement unspecified: Secondary | ICD-10-CM | POA: Diagnosis not present

## 2012-08-01 DIAGNOSIS — M159 Polyosteoarthritis, unspecified: Secondary | ICD-10-CM | POA: Diagnosis not present

## 2012-08-01 DIAGNOSIS — I7300 Raynaud's syndrome without gangrene: Secondary | ICD-10-CM | POA: Diagnosis not present

## 2012-09-14 DIAGNOSIS — L570 Actinic keratosis: Secondary | ICD-10-CM | POA: Diagnosis not present

## 2012-09-14 DIAGNOSIS — B351 Tinea unguium: Secondary | ICD-10-CM | POA: Diagnosis not present

## 2012-09-27 DIAGNOSIS — M3320 Polymyositis, organ involvement unspecified: Secondary | ICD-10-CM | POA: Diagnosis not present

## 2012-09-27 DIAGNOSIS — E039 Hypothyroidism, unspecified: Secondary | ICD-10-CM | POA: Diagnosis not present

## 2012-11-29 DIAGNOSIS — M159 Polyosteoarthritis, unspecified: Secondary | ICD-10-CM | POA: Diagnosis not present

## 2012-11-29 DIAGNOSIS — I7300 Raynaud's syndrome without gangrene: Secondary | ICD-10-CM | POA: Diagnosis not present

## 2012-11-29 DIAGNOSIS — M3320 Polymyositis, organ involvement unspecified: Secondary | ICD-10-CM | POA: Diagnosis not present

## 2012-12-21 DIAGNOSIS — L821 Other seborrheic keratosis: Secondary | ICD-10-CM | POA: Diagnosis not present

## 2012-12-21 DIAGNOSIS — L570 Actinic keratosis: Secondary | ICD-10-CM | POA: Diagnosis not present

## 2012-12-21 DIAGNOSIS — B351 Tinea unguium: Secondary | ICD-10-CM | POA: Diagnosis not present

## 2012-12-26 DIAGNOSIS — E785 Hyperlipidemia, unspecified: Secondary | ICD-10-CM | POA: Diagnosis not present

## 2012-12-26 DIAGNOSIS — Z1331 Encounter for screening for depression: Secondary | ICD-10-CM | POA: Diagnosis not present

## 2012-12-26 DIAGNOSIS — Z9181 History of falling: Secondary | ICD-10-CM | POA: Diagnosis not present

## 2012-12-26 DIAGNOSIS — M3320 Polymyositis, organ involvement unspecified: Secondary | ICD-10-CM | POA: Diagnosis not present

## 2012-12-26 DIAGNOSIS — E039 Hypothyroidism, unspecified: Secondary | ICD-10-CM | POA: Diagnosis not present

## 2012-12-26 DIAGNOSIS — I10 Essential (primary) hypertension: Secondary | ICD-10-CM | POA: Diagnosis not present

## 2012-12-26 DIAGNOSIS — Z6833 Body mass index (BMI) 33.0-33.9, adult: Secondary | ICD-10-CM | POA: Diagnosis not present

## 2013-01-16 DIAGNOSIS — Z1231 Encounter for screening mammogram for malignant neoplasm of breast: Secondary | ICD-10-CM | POA: Diagnosis not present

## 2013-01-24 DIAGNOSIS — Z79899 Other long term (current) drug therapy: Secondary | ICD-10-CM | POA: Diagnosis not present

## 2013-01-25 DIAGNOSIS — R928 Other abnormal and inconclusive findings on diagnostic imaging of breast: Secondary | ICD-10-CM | POA: Diagnosis not present

## 2013-03-28 DIAGNOSIS — IMO0002 Reserved for concepts with insufficient information to code with codable children: Secondary | ICD-10-CM | POA: Diagnosis not present

## 2013-03-28 DIAGNOSIS — M159 Polyosteoarthritis, unspecified: Secondary | ICD-10-CM | POA: Diagnosis not present

## 2013-03-28 DIAGNOSIS — M3320 Polymyositis, organ involvement unspecified: Secondary | ICD-10-CM | POA: Diagnosis not present

## 2013-03-28 DIAGNOSIS — I7300 Raynaud's syndrome without gangrene: Secondary | ICD-10-CM | POA: Diagnosis not present

## 2013-06-28 DIAGNOSIS — M3320 Polymyositis, organ involvement unspecified: Secondary | ICD-10-CM | POA: Diagnosis not present

## 2013-06-28 DIAGNOSIS — I7300 Raynaud's syndrome without gangrene: Secondary | ICD-10-CM | POA: Diagnosis not present

## 2013-06-28 DIAGNOSIS — M159 Polyosteoarthritis, unspecified: Secondary | ICD-10-CM | POA: Diagnosis not present

## 2013-06-28 DIAGNOSIS — IMO0002 Reserved for concepts with insufficient information to code with codable children: Secondary | ICD-10-CM | POA: Diagnosis not present

## 2013-07-03 DIAGNOSIS — E039 Hypothyroidism, unspecified: Secondary | ICD-10-CM | POA: Diagnosis not present

## 2013-07-03 DIAGNOSIS — Z23 Encounter for immunization: Secondary | ICD-10-CM | POA: Diagnosis not present

## 2013-07-03 DIAGNOSIS — I10 Essential (primary) hypertension: Secondary | ICD-10-CM | POA: Diagnosis not present

## 2013-07-03 DIAGNOSIS — E785 Hyperlipidemia, unspecified: Secondary | ICD-10-CM | POA: Diagnosis not present

## 2013-07-03 DIAGNOSIS — M3320 Polymyositis, organ involvement unspecified: Secondary | ICD-10-CM | POA: Diagnosis not present

## 2013-07-03 DIAGNOSIS — Z6834 Body mass index (BMI) 34.0-34.9, adult: Secondary | ICD-10-CM | POA: Diagnosis not present

## 2013-09-19 DIAGNOSIS — I10 Essential (primary) hypertension: Secondary | ICD-10-CM | POA: Diagnosis not present

## 2013-09-19 DIAGNOSIS — H2589 Other age-related cataract: Secondary | ICD-10-CM | POA: Diagnosis not present

## 2013-09-19 DIAGNOSIS — H5200 Hypermetropia, unspecified eye: Secondary | ICD-10-CM | POA: Diagnosis not present

## 2013-09-19 DIAGNOSIS — H52229 Regular astigmatism, unspecified eye: Secondary | ICD-10-CM | POA: Diagnosis not present

## 2013-09-28 DIAGNOSIS — IMO0002 Reserved for concepts with insufficient information to code with codable children: Secondary | ICD-10-CM | POA: Diagnosis not present

## 2013-09-28 DIAGNOSIS — M159 Polyosteoarthritis, unspecified: Secondary | ICD-10-CM | POA: Diagnosis not present

## 2013-09-28 DIAGNOSIS — I7300 Raynaud's syndrome without gangrene: Secondary | ICD-10-CM | POA: Diagnosis not present

## 2013-09-28 DIAGNOSIS — M3320 Polymyositis, organ involvement unspecified: Secondary | ICD-10-CM | POA: Diagnosis not present

## 2013-10-15 DIAGNOSIS — R350 Frequency of micturition: Secondary | ICD-10-CM | POA: Diagnosis not present

## 2013-10-15 DIAGNOSIS — Z6833 Body mass index (BMI) 33.0-33.9, adult: Secondary | ICD-10-CM | POA: Diagnosis not present

## 2013-10-15 DIAGNOSIS — N318 Other neuromuscular dysfunction of bladder: Secondary | ICD-10-CM | POA: Diagnosis not present

## 2013-12-28 DIAGNOSIS — M159 Polyosteoarthritis, unspecified: Secondary | ICD-10-CM | POA: Diagnosis not present

## 2013-12-28 DIAGNOSIS — I7300 Raynaud's syndrome without gangrene: Secondary | ICD-10-CM | POA: Diagnosis not present

## 2013-12-28 DIAGNOSIS — IMO0002 Reserved for concepts with insufficient information to code with codable children: Secondary | ICD-10-CM | POA: Diagnosis not present

## 2013-12-28 DIAGNOSIS — M3320 Polymyositis, organ involvement unspecified: Secondary | ICD-10-CM | POA: Diagnosis not present

## 2014-01-03 DIAGNOSIS — I10 Essential (primary) hypertension: Secondary | ICD-10-CM | POA: Diagnosis not present

## 2014-01-03 DIAGNOSIS — M3320 Polymyositis, organ involvement unspecified: Secondary | ICD-10-CM | POA: Diagnosis not present

## 2014-01-03 DIAGNOSIS — E890 Postprocedural hypothyroidism: Secondary | ICD-10-CM | POA: Diagnosis not present

## 2014-01-03 DIAGNOSIS — E785 Hyperlipidemia, unspecified: Secondary | ICD-10-CM | POA: Diagnosis not present

## 2014-02-01 DIAGNOSIS — Z1231 Encounter for screening mammogram for malignant neoplasm of breast: Secondary | ICD-10-CM | POA: Diagnosis not present

## 2014-02-01 DIAGNOSIS — Z13820 Encounter for screening for osteoporosis: Secondary | ICD-10-CM | POA: Diagnosis not present

## 2014-02-14 DIAGNOSIS — Z471 Aftercare following joint replacement surgery: Secondary | ICD-10-CM | POA: Diagnosis not present

## 2014-02-14 DIAGNOSIS — Z96659 Presence of unspecified artificial knee joint: Secondary | ICD-10-CM | POA: Diagnosis not present

## 2014-03-14 DIAGNOSIS — Z23 Encounter for immunization: Secondary | ICD-10-CM | POA: Diagnosis not present

## 2014-04-01 DIAGNOSIS — M3320 Polymyositis, organ involvement unspecified: Secondary | ICD-10-CM | POA: Diagnosis not present

## 2014-04-01 DIAGNOSIS — M150 Primary generalized (osteo)arthritis: Secondary | ICD-10-CM | POA: Diagnosis not present

## 2014-04-01 DIAGNOSIS — I7300 Raynaud's syndrome without gangrene: Secondary | ICD-10-CM | POA: Diagnosis not present

## 2014-04-01 DIAGNOSIS — M5136 Other intervertebral disc degeneration, lumbar region: Secondary | ICD-10-CM | POA: Diagnosis not present

## 2014-07-02 DIAGNOSIS — M5136 Other intervertebral disc degeneration, lumbar region: Secondary | ICD-10-CM | POA: Diagnosis not present

## 2014-07-02 DIAGNOSIS — M150 Primary generalized (osteo)arthritis: Secondary | ICD-10-CM | POA: Diagnosis not present

## 2014-07-02 DIAGNOSIS — I7300 Raynaud's syndrome without gangrene: Secondary | ICD-10-CM | POA: Diagnosis not present

## 2014-07-02 DIAGNOSIS — M3320 Polymyositis, organ involvement unspecified: Secondary | ICD-10-CM | POA: Diagnosis not present

## 2014-07-11 DIAGNOSIS — E785 Hyperlipidemia, unspecified: Secondary | ICD-10-CM | POA: Diagnosis not present

## 2014-07-11 DIAGNOSIS — Z1389 Encounter for screening for other disorder: Secondary | ICD-10-CM | POA: Diagnosis not present

## 2014-07-11 DIAGNOSIS — E890 Postprocedural hypothyroidism: Secondary | ICD-10-CM | POA: Diagnosis not present

## 2014-07-11 DIAGNOSIS — M3320 Polymyositis, organ involvement unspecified: Secondary | ICD-10-CM | POA: Diagnosis not present

## 2014-07-11 DIAGNOSIS — Z9181 History of falling: Secondary | ICD-10-CM | POA: Diagnosis not present

## 2014-07-11 DIAGNOSIS — H8113 Benign paroxysmal vertigo, bilateral: Secondary | ICD-10-CM | POA: Diagnosis not present

## 2014-07-11 DIAGNOSIS — Z6833 Body mass index (BMI) 33.0-33.9, adult: Secondary | ICD-10-CM | POA: Diagnosis not present

## 2014-07-11 DIAGNOSIS — I10 Essential (primary) hypertension: Secondary | ICD-10-CM | POA: Diagnosis not present

## 2014-10-01 DIAGNOSIS — I7300 Raynaud's syndrome without gangrene: Secondary | ICD-10-CM | POA: Diagnosis not present

## 2014-10-01 DIAGNOSIS — M150 Primary generalized (osteo)arthritis: Secondary | ICD-10-CM | POA: Diagnosis not present

## 2014-10-01 DIAGNOSIS — M3320 Polymyositis, organ involvement unspecified: Secondary | ICD-10-CM | POA: Diagnosis not present

## 2014-10-01 DIAGNOSIS — M5136 Other intervertebral disc degeneration, lumbar region: Secondary | ICD-10-CM | POA: Diagnosis not present

## 2014-12-20 DIAGNOSIS — M3322 Polymyositis with myopathy: Secondary | ICD-10-CM | POA: Diagnosis not present

## 2014-12-20 DIAGNOSIS — Z79899 Other long term (current) drug therapy: Secondary | ICD-10-CM | POA: Diagnosis not present

## 2015-01-15 DIAGNOSIS — Z1389 Encounter for screening for other disorder: Secondary | ICD-10-CM | POA: Diagnosis not present

## 2015-01-15 DIAGNOSIS — I10 Essential (primary) hypertension: Secondary | ICD-10-CM | POA: Diagnosis not present

## 2015-01-15 DIAGNOSIS — N3281 Overactive bladder: Secondary | ICD-10-CM | POA: Diagnosis not present

## 2015-01-15 DIAGNOSIS — Z9181 History of falling: Secondary | ICD-10-CM | POA: Diagnosis not present

## 2015-01-15 DIAGNOSIS — Z79899 Other long term (current) drug therapy: Secondary | ICD-10-CM | POA: Diagnosis not present

## 2015-01-15 DIAGNOSIS — E785 Hyperlipidemia, unspecified: Secondary | ICD-10-CM | POA: Diagnosis not present

## 2015-01-15 DIAGNOSIS — E890 Postprocedural hypothyroidism: Secondary | ICD-10-CM | POA: Diagnosis not present

## 2015-01-15 DIAGNOSIS — Z1231 Encounter for screening mammogram for malignant neoplasm of breast: Secondary | ICD-10-CM | POA: Diagnosis not present

## 2015-01-15 DIAGNOSIS — Z6830 Body mass index (BMI) 30.0-30.9, adult: Secondary | ICD-10-CM | POA: Diagnosis not present

## 2015-01-15 DIAGNOSIS — M3320 Polymyositis, organ involvement unspecified: Secondary | ICD-10-CM | POA: Diagnosis not present

## 2015-01-28 DIAGNOSIS — N3281 Overactive bladder: Secondary | ICD-10-CM | POA: Diagnosis not present

## 2015-01-28 DIAGNOSIS — R32 Unspecified urinary incontinence: Secondary | ICD-10-CM | POA: Diagnosis not present

## 2015-02-11 DIAGNOSIS — Z1231 Encounter for screening mammogram for malignant neoplasm of breast: Secondary | ICD-10-CM | POA: Diagnosis not present

## 2015-02-12 DIAGNOSIS — N3281 Overactive bladder: Secondary | ICD-10-CM | POA: Diagnosis not present

## 2015-02-12 DIAGNOSIS — E890 Postprocedural hypothyroidism: Secondary | ICD-10-CM | POA: Diagnosis not present

## 2015-02-12 DIAGNOSIS — R32 Unspecified urinary incontinence: Secondary | ICD-10-CM | POA: Diagnosis not present

## 2015-02-14 DIAGNOSIS — Z23 Encounter for immunization: Secondary | ICD-10-CM | POA: Diagnosis not present

## 2015-03-03 DIAGNOSIS — R32 Unspecified urinary incontinence: Secondary | ICD-10-CM | POA: Diagnosis not present

## 2015-03-03 DIAGNOSIS — N3281 Overactive bladder: Secondary | ICD-10-CM | POA: Diagnosis not present

## 2015-03-12 DIAGNOSIS — E890 Postprocedural hypothyroidism: Secondary | ICD-10-CM | POA: Diagnosis not present

## 2015-03-21 DIAGNOSIS — H52223 Regular astigmatism, bilateral: Secondary | ICD-10-CM | POA: Diagnosis not present

## 2015-03-21 DIAGNOSIS — H5202 Hypermetropia, left eye: Secondary | ICD-10-CM | POA: Diagnosis not present

## 2015-03-21 DIAGNOSIS — H43813 Vitreous degeneration, bilateral: Secondary | ICD-10-CM | POA: Diagnosis not present

## 2015-03-21 DIAGNOSIS — H25813 Combined forms of age-related cataract, bilateral: Secondary | ICD-10-CM | POA: Diagnosis not present

## 2015-03-21 DIAGNOSIS — H524 Presbyopia: Secondary | ICD-10-CM | POA: Diagnosis not present

## 2015-03-21 DIAGNOSIS — H5211 Myopia, right eye: Secondary | ICD-10-CM | POA: Diagnosis not present

## 2015-03-21 DIAGNOSIS — H35373 Puckering of macula, bilateral: Secondary | ICD-10-CM | POA: Diagnosis not present

## 2015-03-21 DIAGNOSIS — I10 Essential (primary) hypertension: Secondary | ICD-10-CM | POA: Diagnosis not present

## 2015-03-21 DIAGNOSIS — H43393 Other vitreous opacities, bilateral: Secondary | ICD-10-CM | POA: Diagnosis not present

## 2015-04-03 DIAGNOSIS — N3281 Overactive bladder: Secondary | ICD-10-CM | POA: Diagnosis not present

## 2015-04-03 DIAGNOSIS — K5900 Constipation, unspecified: Secondary | ICD-10-CM | POA: Diagnosis not present

## 2015-04-03 DIAGNOSIS — R32 Unspecified urinary incontinence: Secondary | ICD-10-CM | POA: Diagnosis not present

## 2015-04-04 DIAGNOSIS — M150 Primary generalized (osteo)arthritis: Secondary | ICD-10-CM | POA: Diagnosis not present

## 2015-04-04 DIAGNOSIS — M3322 Polymyositis with myopathy: Secondary | ICD-10-CM | POA: Diagnosis not present

## 2015-04-04 DIAGNOSIS — M5136 Other intervertebral disc degeneration, lumbar region: Secondary | ICD-10-CM | POA: Diagnosis not present

## 2015-04-04 DIAGNOSIS — I7300 Raynaud's syndrome without gangrene: Secondary | ICD-10-CM | POA: Diagnosis not present

## 2015-07-04 DIAGNOSIS — Z79899 Other long term (current) drug therapy: Secondary | ICD-10-CM | POA: Diagnosis not present

## 2015-07-04 DIAGNOSIS — I7300 Raynaud's syndrome without gangrene: Secondary | ICD-10-CM | POA: Diagnosis not present

## 2015-07-04 DIAGNOSIS — M5136 Other intervertebral disc degeneration, lumbar region: Secondary | ICD-10-CM | POA: Diagnosis not present

## 2015-07-04 DIAGNOSIS — M150 Primary generalized (osteo)arthritis: Secondary | ICD-10-CM | POA: Diagnosis not present

## 2015-07-04 DIAGNOSIS — M3322 Polymyositis with myopathy: Secondary | ICD-10-CM | POA: Diagnosis not present

## 2015-07-08 DIAGNOSIS — N3281 Overactive bladder: Secondary | ICD-10-CM | POA: Diagnosis not present

## 2015-07-08 DIAGNOSIS — K5900 Constipation, unspecified: Secondary | ICD-10-CM | POA: Diagnosis not present

## 2015-07-22 DIAGNOSIS — I10 Essential (primary) hypertension: Secondary | ICD-10-CM | POA: Diagnosis not present

## 2015-07-22 DIAGNOSIS — E890 Postprocedural hypothyroidism: Secondary | ICD-10-CM | POA: Diagnosis not present

## 2015-07-22 DIAGNOSIS — E785 Hyperlipidemia, unspecified: Secondary | ICD-10-CM | POA: Diagnosis not present

## 2015-07-22 DIAGNOSIS — M3320 Polymyositis, organ involvement unspecified: Secondary | ICD-10-CM | POA: Diagnosis not present

## 2015-07-22 DIAGNOSIS — Z6828 Body mass index (BMI) 28.0-28.9, adult: Secondary | ICD-10-CM | POA: Diagnosis not present

## 2015-07-25 DIAGNOSIS — H2512 Age-related nuclear cataract, left eye: Secondary | ICD-10-CM | POA: Diagnosis not present

## 2015-08-19 DIAGNOSIS — I10 Essential (primary) hypertension: Secondary | ICD-10-CM | POA: Diagnosis not present

## 2015-08-19 DIAGNOSIS — H2512 Age-related nuclear cataract, left eye: Secondary | ICD-10-CM | POA: Diagnosis not present

## 2015-08-19 DIAGNOSIS — Z79899 Other long term (current) drug therapy: Secondary | ICD-10-CM | POA: Diagnosis not present

## 2015-08-19 DIAGNOSIS — H25812 Combined forms of age-related cataract, left eye: Secondary | ICD-10-CM | POA: Diagnosis not present

## 2015-08-19 DIAGNOSIS — M1990 Unspecified osteoarthritis, unspecified site: Secondary | ICD-10-CM | POA: Diagnosis not present

## 2015-08-19 DIAGNOSIS — E039 Hypothyroidism, unspecified: Secondary | ICD-10-CM | POA: Diagnosis not present

## 2015-08-19 DIAGNOSIS — H26492 Other secondary cataract, left eye: Secondary | ICD-10-CM | POA: Diagnosis not present

## 2015-08-19 DIAGNOSIS — Z961 Presence of intraocular lens: Secondary | ICD-10-CM | POA: Diagnosis not present

## 2015-09-16 DIAGNOSIS — Z961 Presence of intraocular lens: Secondary | ICD-10-CM | POA: Diagnosis not present

## 2015-09-16 DIAGNOSIS — G629 Polyneuropathy, unspecified: Secondary | ICD-10-CM | POA: Diagnosis not present

## 2015-09-16 DIAGNOSIS — E890 Postprocedural hypothyroidism: Secondary | ICD-10-CM | POA: Diagnosis not present

## 2015-09-16 DIAGNOSIS — I10 Essential (primary) hypertension: Secondary | ICD-10-CM | POA: Diagnosis not present

## 2015-09-16 DIAGNOSIS — H25811 Combined forms of age-related cataract, right eye: Secondary | ICD-10-CM | POA: Diagnosis not present

## 2015-09-16 DIAGNOSIS — M1990 Unspecified osteoarthritis, unspecified site: Secondary | ICD-10-CM | POA: Diagnosis not present

## 2015-09-16 DIAGNOSIS — H2511 Age-related nuclear cataract, right eye: Secondary | ICD-10-CM | POA: Diagnosis not present

## 2015-09-16 DIAGNOSIS — H26492 Other secondary cataract, left eye: Secondary | ICD-10-CM | POA: Diagnosis not present

## 2015-09-16 DIAGNOSIS — Z9841 Cataract extraction status, right eye: Secondary | ICD-10-CM | POA: Diagnosis not present

## 2015-09-16 DIAGNOSIS — Z79899 Other long term (current) drug therapy: Secondary | ICD-10-CM | POA: Diagnosis not present

## 2015-09-16 DIAGNOSIS — H25812 Combined forms of age-related cataract, left eye: Secondary | ICD-10-CM | POA: Diagnosis not present

## 2015-09-17 DIAGNOSIS — H25811 Combined forms of age-related cataract, right eye: Secondary | ICD-10-CM | POA: Diagnosis not present

## 2015-09-17 DIAGNOSIS — Z961 Presence of intraocular lens: Secondary | ICD-10-CM | POA: Diagnosis not present

## 2015-09-17 DIAGNOSIS — I10 Essential (primary) hypertension: Secondary | ICD-10-CM | POA: Diagnosis not present

## 2015-09-17 DIAGNOSIS — Z9841 Cataract extraction status, right eye: Secondary | ICD-10-CM | POA: Diagnosis not present

## 2015-09-17 DIAGNOSIS — H25812 Combined forms of age-related cataract, left eye: Secondary | ICD-10-CM | POA: Diagnosis not present

## 2015-09-17 DIAGNOSIS — H26492 Other secondary cataract, left eye: Secondary | ICD-10-CM | POA: Diagnosis not present

## 2015-10-03 DIAGNOSIS — Z79899 Other long term (current) drug therapy: Secondary | ICD-10-CM | POA: Diagnosis not present

## 2015-10-03 DIAGNOSIS — I7300 Raynaud's syndrome without gangrene: Secondary | ICD-10-CM | POA: Diagnosis not present

## 2015-10-03 DIAGNOSIS — M150 Primary generalized (osteo)arthritis: Secondary | ICD-10-CM | POA: Diagnosis not present

## 2015-10-03 DIAGNOSIS — M3322 Polymyositis with myopathy: Secondary | ICD-10-CM | POA: Diagnosis not present

## 2015-10-03 DIAGNOSIS — M5136 Other intervertebral disc degeneration, lumbar region: Secondary | ICD-10-CM | POA: Diagnosis not present

## 2015-11-05 DIAGNOSIS — N3281 Overactive bladder: Secondary | ICD-10-CM | POA: Diagnosis not present

## 2015-11-05 DIAGNOSIS — R32 Unspecified urinary incontinence: Secondary | ICD-10-CM | POA: Diagnosis not present

## 2015-12-26 DIAGNOSIS — M5136 Other intervertebral disc degeneration, lumbar region: Secondary | ICD-10-CM | POA: Diagnosis not present

## 2015-12-26 DIAGNOSIS — M150 Primary generalized (osteo)arthritis: Secondary | ICD-10-CM | POA: Diagnosis not present

## 2015-12-26 DIAGNOSIS — Z79899 Other long term (current) drug therapy: Secondary | ICD-10-CM | POA: Diagnosis not present

## 2015-12-26 DIAGNOSIS — M3322 Polymyositis with myopathy: Secondary | ICD-10-CM | POA: Diagnosis not present

## 2015-12-26 DIAGNOSIS — I7300 Raynaud's syndrome without gangrene: Secondary | ICD-10-CM | POA: Diagnosis not present

## 2016-01-19 DIAGNOSIS — Z6826 Body mass index (BMI) 26.0-26.9, adult: Secondary | ICD-10-CM | POA: Diagnosis not present

## 2016-01-19 DIAGNOSIS — E663 Overweight: Secondary | ICD-10-CM | POA: Diagnosis not present

## 2016-01-19 DIAGNOSIS — M3320 Polymyositis, organ involvement unspecified: Secondary | ICD-10-CM | POA: Diagnosis not present

## 2016-01-19 DIAGNOSIS — E890 Postprocedural hypothyroidism: Secondary | ICD-10-CM | POA: Diagnosis not present

## 2016-01-19 DIAGNOSIS — Z9181 History of falling: Secondary | ICD-10-CM | POA: Diagnosis not present

## 2016-01-19 DIAGNOSIS — I10 Essential (primary) hypertension: Secondary | ICD-10-CM | POA: Diagnosis not present

## 2016-01-19 DIAGNOSIS — E785 Hyperlipidemia, unspecified: Secondary | ICD-10-CM | POA: Diagnosis not present

## 2016-01-19 DIAGNOSIS — M8589 Other specified disorders of bone density and structure, multiple sites: Secondary | ICD-10-CM | POA: Diagnosis not present

## 2016-01-19 DIAGNOSIS — Z1231 Encounter for screening mammogram for malignant neoplasm of breast: Secondary | ICD-10-CM | POA: Diagnosis not present

## 2016-01-19 DIAGNOSIS — Z1389 Encounter for screening for other disorder: Secondary | ICD-10-CM | POA: Diagnosis not present

## 2016-02-25 DIAGNOSIS — Z13820 Encounter for screening for osteoporosis: Secondary | ICD-10-CM | POA: Diagnosis not present

## 2016-02-25 DIAGNOSIS — Z1231 Encounter for screening mammogram for malignant neoplasm of breast: Secondary | ICD-10-CM | POA: Diagnosis not present

## 2016-02-25 DIAGNOSIS — M8589 Other specified disorders of bone density and structure, multiple sites: Secondary | ICD-10-CM | POA: Diagnosis not present

## 2016-03-08 DIAGNOSIS — N952 Postmenopausal atrophic vaginitis: Secondary | ICD-10-CM | POA: Diagnosis not present

## 2016-03-08 DIAGNOSIS — N3281 Overactive bladder: Secondary | ICD-10-CM | POA: Diagnosis not present

## 2016-03-09 DIAGNOSIS — Z23 Encounter for immunization: Secondary | ICD-10-CM | POA: Diagnosis not present

## 2016-03-16 DIAGNOSIS — C44722 Squamous cell carcinoma of skin of right lower limb, including hip: Secondary | ICD-10-CM | POA: Diagnosis not present

## 2016-03-16 DIAGNOSIS — L570 Actinic keratosis: Secondary | ICD-10-CM | POA: Diagnosis not present

## 2016-03-16 DIAGNOSIS — D225 Melanocytic nevi of trunk: Secondary | ICD-10-CM | POA: Diagnosis not present

## 2016-03-29 DIAGNOSIS — M150 Primary generalized (osteo)arthritis: Secondary | ICD-10-CM | POA: Diagnosis not present

## 2016-03-29 DIAGNOSIS — M3322 Polymyositis with myopathy: Secondary | ICD-10-CM | POA: Diagnosis not present

## 2016-03-29 DIAGNOSIS — Z79899 Other long term (current) drug therapy: Secondary | ICD-10-CM | POA: Diagnosis not present

## 2016-03-29 DIAGNOSIS — R296 Repeated falls: Secondary | ICD-10-CM | POA: Diagnosis not present

## 2016-03-29 DIAGNOSIS — M5136 Other intervertebral disc degeneration, lumbar region: Secondary | ICD-10-CM | POA: Diagnosis not present

## 2016-03-29 DIAGNOSIS — I7300 Raynaud's syndrome without gangrene: Secondary | ICD-10-CM | POA: Diagnosis not present

## 2016-04-01 DIAGNOSIS — C44722 Squamous cell carcinoma of skin of right lower limb, including hip: Secondary | ICD-10-CM | POA: Diagnosis not present

## 2016-04-06 DIAGNOSIS — R2689 Other abnormalities of gait and mobility: Secondary | ICD-10-CM | POA: Diagnosis not present

## 2016-04-06 DIAGNOSIS — M6281 Muscle weakness (generalized): Secondary | ICD-10-CM | POA: Diagnosis not present

## 2016-04-08 DIAGNOSIS — R2689 Other abnormalities of gait and mobility: Secondary | ICD-10-CM | POA: Diagnosis not present

## 2016-04-08 DIAGNOSIS — M6281 Muscle weakness (generalized): Secondary | ICD-10-CM | POA: Diagnosis not present

## 2016-04-13 DIAGNOSIS — M6281 Muscle weakness (generalized): Secondary | ICD-10-CM | POA: Diagnosis not present

## 2016-04-13 DIAGNOSIS — R2689 Other abnormalities of gait and mobility: Secondary | ICD-10-CM | POA: Diagnosis not present

## 2016-04-15 DIAGNOSIS — R2689 Other abnormalities of gait and mobility: Secondary | ICD-10-CM | POA: Diagnosis not present

## 2016-04-15 DIAGNOSIS — M6281 Muscle weakness (generalized): Secondary | ICD-10-CM | POA: Diagnosis not present

## 2016-04-20 DIAGNOSIS — M6281 Muscle weakness (generalized): Secondary | ICD-10-CM | POA: Diagnosis not present

## 2016-04-20 DIAGNOSIS — R2689 Other abnormalities of gait and mobility: Secondary | ICD-10-CM | POA: Diagnosis not present

## 2016-04-22 DIAGNOSIS — M6281 Muscle weakness (generalized): Secondary | ICD-10-CM | POA: Diagnosis not present

## 2016-04-22 DIAGNOSIS — R2689 Other abnormalities of gait and mobility: Secondary | ICD-10-CM | POA: Diagnosis not present

## 2016-04-27 DIAGNOSIS — R2689 Other abnormalities of gait and mobility: Secondary | ICD-10-CM | POA: Diagnosis not present

## 2016-04-27 DIAGNOSIS — M6281 Muscle weakness (generalized): Secondary | ICD-10-CM | POA: Diagnosis not present

## 2016-05-04 DIAGNOSIS — R2689 Other abnormalities of gait and mobility: Secondary | ICD-10-CM | POA: Diagnosis not present

## 2016-05-04 DIAGNOSIS — M6281 Muscle weakness (generalized): Secondary | ICD-10-CM | POA: Diagnosis not present

## 2016-05-06 DIAGNOSIS — M6281 Muscle weakness (generalized): Secondary | ICD-10-CM | POA: Diagnosis not present

## 2016-05-06 DIAGNOSIS — R2689 Other abnormalities of gait and mobility: Secondary | ICD-10-CM | POA: Diagnosis not present

## 2016-05-11 DIAGNOSIS — M6281 Muscle weakness (generalized): Secondary | ICD-10-CM | POA: Diagnosis not present

## 2016-05-11 DIAGNOSIS — R2689 Other abnormalities of gait and mobility: Secondary | ICD-10-CM | POA: Diagnosis not present

## 2016-05-13 DIAGNOSIS — R2689 Other abnormalities of gait and mobility: Secondary | ICD-10-CM | POA: Diagnosis not present

## 2016-05-13 DIAGNOSIS — M6281 Muscle weakness (generalized): Secondary | ICD-10-CM | POA: Diagnosis not present

## 2016-05-18 DIAGNOSIS — R2689 Other abnormalities of gait and mobility: Secondary | ICD-10-CM | POA: Diagnosis not present

## 2016-05-18 DIAGNOSIS — M6281 Muscle weakness (generalized): Secondary | ICD-10-CM | POA: Diagnosis not present

## 2016-05-20 DIAGNOSIS — M6281 Muscle weakness (generalized): Secondary | ICD-10-CM | POA: Diagnosis not present

## 2016-05-20 DIAGNOSIS — R2689 Other abnormalities of gait and mobility: Secondary | ICD-10-CM | POA: Diagnosis not present

## 2016-05-25 DIAGNOSIS — M6281 Muscle weakness (generalized): Secondary | ICD-10-CM | POA: Diagnosis not present

## 2016-05-25 DIAGNOSIS — R2689 Other abnormalities of gait and mobility: Secondary | ICD-10-CM | POA: Diagnosis not present

## 2016-05-27 DIAGNOSIS — M6281 Muscle weakness (generalized): Secondary | ICD-10-CM | POA: Diagnosis not present

## 2016-05-27 DIAGNOSIS — R2689 Other abnormalities of gait and mobility: Secondary | ICD-10-CM | POA: Diagnosis not present

## 2016-06-15 DIAGNOSIS — H5213 Myopia, bilateral: Secondary | ICD-10-CM | POA: Diagnosis not present

## 2016-06-15 DIAGNOSIS — H52223 Regular astigmatism, bilateral: Secondary | ICD-10-CM | POA: Diagnosis not present

## 2016-06-15 DIAGNOSIS — H04123 Dry eye syndrome of bilateral lacrimal glands: Secondary | ICD-10-CM | POA: Diagnosis not present

## 2016-06-15 DIAGNOSIS — H16223 Keratoconjunctivitis sicca, not specified as Sjogren's, bilateral: Secondary | ICD-10-CM | POA: Diagnosis not present

## 2016-06-15 DIAGNOSIS — H43393 Other vitreous opacities, bilateral: Secondary | ICD-10-CM | POA: Diagnosis not present

## 2016-06-15 DIAGNOSIS — I10 Essential (primary) hypertension: Secondary | ICD-10-CM | POA: Diagnosis not present

## 2016-06-15 DIAGNOSIS — H524 Presbyopia: Secondary | ICD-10-CM | POA: Diagnosis not present

## 2016-06-15 DIAGNOSIS — H26491 Other secondary cataract, right eye: Secondary | ICD-10-CM | POA: Diagnosis not present

## 2016-06-15 DIAGNOSIS — Z961 Presence of intraocular lens: Secondary | ICD-10-CM | POA: Diagnosis not present

## 2016-06-15 DIAGNOSIS — H26492 Other secondary cataract, left eye: Secondary | ICD-10-CM | POA: Diagnosis not present

## 2016-06-15 DIAGNOSIS — H43813 Vitreous degeneration, bilateral: Secondary | ICD-10-CM | POA: Diagnosis not present

## 2016-06-17 DIAGNOSIS — M6281 Muscle weakness (generalized): Secondary | ICD-10-CM | POA: Diagnosis not present

## 2016-06-17 DIAGNOSIS — R2689 Other abnormalities of gait and mobility: Secondary | ICD-10-CM | POA: Diagnosis not present

## 2016-07-02 DIAGNOSIS — R296 Repeated falls: Secondary | ICD-10-CM | POA: Diagnosis not present

## 2016-07-02 DIAGNOSIS — I7300 Raynaud's syndrome without gangrene: Secondary | ICD-10-CM | POA: Diagnosis not present

## 2016-07-02 DIAGNOSIS — E663 Overweight: Secondary | ICD-10-CM | POA: Diagnosis not present

## 2016-07-02 DIAGNOSIS — Z6825 Body mass index (BMI) 25.0-25.9, adult: Secondary | ICD-10-CM | POA: Diagnosis not present

## 2016-07-02 DIAGNOSIS — H04123 Dry eye syndrome of bilateral lacrimal glands: Secondary | ICD-10-CM | POA: Diagnosis not present

## 2016-07-02 DIAGNOSIS — M3322 Polymyositis with myopathy: Secondary | ICD-10-CM | POA: Diagnosis not present

## 2016-07-02 DIAGNOSIS — M150 Primary generalized (osteo)arthritis: Secondary | ICD-10-CM | POA: Diagnosis not present

## 2016-07-02 DIAGNOSIS — Z79899 Other long term (current) drug therapy: Secondary | ICD-10-CM | POA: Diagnosis not present

## 2016-07-02 DIAGNOSIS — M5136 Other intervertebral disc degeneration, lumbar region: Secondary | ICD-10-CM | POA: Diagnosis not present

## 2016-07-12 DIAGNOSIS — R32 Unspecified urinary incontinence: Secondary | ICD-10-CM | POA: Diagnosis not present

## 2016-07-12 DIAGNOSIS — N952 Postmenopausal atrophic vaginitis: Secondary | ICD-10-CM | POA: Diagnosis not present

## 2016-07-12 DIAGNOSIS — N3281 Overactive bladder: Secondary | ICD-10-CM | POA: Diagnosis not present

## 2016-07-26 DIAGNOSIS — I7300 Raynaud's syndrome without gangrene: Secondary | ICD-10-CM | POA: Diagnosis not present

## 2016-07-26 DIAGNOSIS — E890 Postprocedural hypothyroidism: Secondary | ICD-10-CM | POA: Diagnosis not present

## 2016-07-26 DIAGNOSIS — I10 Essential (primary) hypertension: Secondary | ICD-10-CM | POA: Diagnosis not present

## 2016-07-26 DIAGNOSIS — E785 Hyperlipidemia, unspecified: Secondary | ICD-10-CM | POA: Diagnosis not present

## 2016-07-26 DIAGNOSIS — M3320 Polymyositis, organ involvement unspecified: Secondary | ICD-10-CM | POA: Diagnosis not present

## 2016-07-26 DIAGNOSIS — Z6825 Body mass index (BMI) 25.0-25.9, adult: Secondary | ICD-10-CM | POA: Diagnosis not present

## 2016-08-03 DIAGNOSIS — L570 Actinic keratosis: Secondary | ICD-10-CM | POA: Diagnosis not present

## 2016-09-13 DIAGNOSIS — E785 Hyperlipidemia, unspecified: Secondary | ICD-10-CM | POA: Diagnosis not present

## 2016-09-13 DIAGNOSIS — Z1389 Encounter for screening for other disorder: Secondary | ICD-10-CM | POA: Diagnosis not present

## 2016-09-13 DIAGNOSIS — Z0000 Encounter for general adult medical examination without abnormal findings: Secondary | ICD-10-CM | POA: Diagnosis not present

## 2016-09-13 DIAGNOSIS — Z9181 History of falling: Secondary | ICD-10-CM | POA: Diagnosis not present

## 2016-09-13 DIAGNOSIS — Z136 Encounter for screening for cardiovascular disorders: Secondary | ICD-10-CM | POA: Diagnosis not present

## 2016-10-01 DIAGNOSIS — Z6824 Body mass index (BMI) 24.0-24.9, adult: Secondary | ICD-10-CM | POA: Diagnosis not present

## 2016-10-01 DIAGNOSIS — M3322 Polymyositis with myopathy: Secondary | ICD-10-CM | POA: Diagnosis not present

## 2016-10-01 DIAGNOSIS — Z79899 Other long term (current) drug therapy: Secondary | ICD-10-CM | POA: Diagnosis not present

## 2016-10-01 DIAGNOSIS — R296 Repeated falls: Secondary | ICD-10-CM | POA: Diagnosis not present

## 2016-10-01 DIAGNOSIS — E663 Overweight: Secondary | ICD-10-CM | POA: Diagnosis not present

## 2016-10-01 DIAGNOSIS — H04123 Dry eye syndrome of bilateral lacrimal glands: Secondary | ICD-10-CM | POA: Diagnosis not present

## 2016-10-01 DIAGNOSIS — M5136 Other intervertebral disc degeneration, lumbar region: Secondary | ICD-10-CM | POA: Diagnosis not present

## 2016-10-01 DIAGNOSIS — I7300 Raynaud's syndrome without gangrene: Secondary | ICD-10-CM | POA: Diagnosis not present

## 2016-10-01 DIAGNOSIS — M150 Primary generalized (osteo)arthritis: Secondary | ICD-10-CM | POA: Diagnosis not present

## 2016-10-14 DIAGNOSIS — N3281 Overactive bladder: Secondary | ICD-10-CM | POA: Diagnosis not present

## 2016-10-14 DIAGNOSIS — R32 Unspecified urinary incontinence: Secondary | ICD-10-CM | POA: Diagnosis not present

## 2016-10-14 DIAGNOSIS — N952 Postmenopausal atrophic vaginitis: Secondary | ICD-10-CM | POA: Diagnosis not present

## 2017-01-14 DIAGNOSIS — N952 Postmenopausal atrophic vaginitis: Secondary | ICD-10-CM | POA: Diagnosis not present

## 2017-01-14 DIAGNOSIS — N3281 Overactive bladder: Secondary | ICD-10-CM | POA: Diagnosis not present

## 2017-01-14 DIAGNOSIS — R32 Unspecified urinary incontinence: Secondary | ICD-10-CM | POA: Diagnosis not present

## 2017-01-24 DIAGNOSIS — Z1231 Encounter for screening mammogram for malignant neoplasm of breast: Secondary | ICD-10-CM | POA: Diagnosis not present

## 2017-01-24 DIAGNOSIS — Z6823 Body mass index (BMI) 23.0-23.9, adult: Secondary | ICD-10-CM | POA: Diagnosis not present

## 2017-01-24 DIAGNOSIS — I10 Essential (primary) hypertension: Secondary | ICD-10-CM | POA: Diagnosis not present

## 2017-01-24 DIAGNOSIS — M3320 Polymyositis, organ involvement unspecified: Secondary | ICD-10-CM | POA: Diagnosis not present

## 2017-01-24 DIAGNOSIS — E785 Hyperlipidemia, unspecified: Secondary | ICD-10-CM | POA: Diagnosis not present

## 2017-01-24 DIAGNOSIS — E890 Postprocedural hypothyroidism: Secondary | ICD-10-CM | POA: Diagnosis not present

## 2017-01-24 DIAGNOSIS — I7300 Raynaud's syndrome without gangrene: Secondary | ICD-10-CM | POA: Diagnosis not present

## 2017-02-25 DIAGNOSIS — Z1231 Encounter for screening mammogram for malignant neoplasm of breast: Secondary | ICD-10-CM | POA: Diagnosis not present

## 2017-04-04 DIAGNOSIS — Z79899 Other long term (current) drug therapy: Secondary | ICD-10-CM | POA: Diagnosis not present

## 2017-04-04 DIAGNOSIS — M3322 Polymyositis with myopathy: Secondary | ICD-10-CM | POA: Diagnosis not present

## 2017-04-04 DIAGNOSIS — M5136 Other intervertebral disc degeneration, lumbar region: Secondary | ICD-10-CM | POA: Diagnosis not present

## 2017-04-04 DIAGNOSIS — M150 Primary generalized (osteo)arthritis: Secondary | ICD-10-CM | POA: Diagnosis not present

## 2017-04-04 DIAGNOSIS — I7300 Raynaud's syndrome without gangrene: Secondary | ICD-10-CM | POA: Diagnosis not present

## 2017-04-04 DIAGNOSIS — H04123 Dry eye syndrome of bilateral lacrimal glands: Secondary | ICD-10-CM | POA: Diagnosis not present

## 2017-04-04 DIAGNOSIS — R296 Repeated falls: Secondary | ICD-10-CM | POA: Diagnosis not present

## 2017-04-04 DIAGNOSIS — Z6823 Body mass index (BMI) 23.0-23.9, adult: Secondary | ICD-10-CM | POA: Diagnosis not present

## 2017-07-18 DIAGNOSIS — N3281 Overactive bladder: Secondary | ICD-10-CM | POA: Diagnosis not present

## 2017-07-18 DIAGNOSIS — N952 Postmenopausal atrophic vaginitis: Secondary | ICD-10-CM | POA: Diagnosis not present

## 2017-07-27 DIAGNOSIS — J208 Acute bronchitis due to other specified organisms: Secondary | ICD-10-CM | POA: Diagnosis not present

## 2017-07-27 DIAGNOSIS — E890 Postprocedural hypothyroidism: Secondary | ICD-10-CM | POA: Diagnosis not present

## 2017-07-27 DIAGNOSIS — I10 Essential (primary) hypertension: Secondary | ICD-10-CM | POA: Diagnosis not present

## 2017-07-27 DIAGNOSIS — I7300 Raynaud's syndrome without gangrene: Secondary | ICD-10-CM | POA: Diagnosis not present

## 2017-07-27 DIAGNOSIS — M3320 Polymyositis, organ involvement unspecified: Secondary | ICD-10-CM | POA: Diagnosis not present

## 2017-07-27 DIAGNOSIS — E785 Hyperlipidemia, unspecified: Secondary | ICD-10-CM | POA: Diagnosis not present

## 2017-09-15 DIAGNOSIS — E785 Hyperlipidemia, unspecified: Secondary | ICD-10-CM | POA: Diagnosis not present

## 2017-09-15 DIAGNOSIS — Z0000 Encounter for general adult medical examination without abnormal findings: Secondary | ICD-10-CM | POA: Diagnosis not present

## 2017-09-15 DIAGNOSIS — Z9181 History of falling: Secondary | ICD-10-CM | POA: Diagnosis not present

## 2017-09-15 DIAGNOSIS — Z136 Encounter for screening for cardiovascular disorders: Secondary | ICD-10-CM | POA: Diagnosis not present

## 2017-09-15 DIAGNOSIS — Z1331 Encounter for screening for depression: Secondary | ICD-10-CM | POA: Diagnosis not present

## 2017-09-15 DIAGNOSIS — Z1231 Encounter for screening mammogram for malignant neoplasm of breast: Secondary | ICD-10-CM | POA: Diagnosis not present

## 2017-10-03 DIAGNOSIS — Z79899 Other long term (current) drug therapy: Secondary | ICD-10-CM | POA: Diagnosis not present

## 2017-10-03 DIAGNOSIS — I7300 Raynaud's syndrome without gangrene: Secondary | ICD-10-CM | POA: Diagnosis not present

## 2017-10-03 DIAGNOSIS — R233 Spontaneous ecchymoses: Secondary | ICD-10-CM | POA: Diagnosis not present

## 2017-10-03 DIAGNOSIS — R296 Repeated falls: Secondary | ICD-10-CM | POA: Diagnosis not present

## 2017-10-03 DIAGNOSIS — M150 Primary generalized (osteo)arthritis: Secondary | ICD-10-CM | POA: Diagnosis not present

## 2017-10-03 DIAGNOSIS — R634 Abnormal weight loss: Secondary | ICD-10-CM | POA: Diagnosis not present

## 2017-10-03 DIAGNOSIS — Z6821 Body mass index (BMI) 21.0-21.9, adult: Secondary | ICD-10-CM | POA: Diagnosis not present

## 2017-10-03 DIAGNOSIS — M5136 Other intervertebral disc degeneration, lumbar region: Secondary | ICD-10-CM | POA: Diagnosis not present

## 2017-10-03 DIAGNOSIS — R1310 Dysphagia, unspecified: Secondary | ICD-10-CM | POA: Diagnosis not present

## 2017-10-03 DIAGNOSIS — H04123 Dry eye syndrome of bilateral lacrimal glands: Secondary | ICD-10-CM | POA: Diagnosis not present

## 2017-10-03 DIAGNOSIS — M3322 Polymyositis with myopathy: Secondary | ICD-10-CM | POA: Diagnosis not present

## 2017-10-25 DIAGNOSIS — H43393 Other vitreous opacities, bilateral: Secondary | ICD-10-CM | POA: Diagnosis not present

## 2017-10-25 DIAGNOSIS — H26492 Other secondary cataract, left eye: Secondary | ICD-10-CM | POA: Diagnosis not present

## 2017-10-25 DIAGNOSIS — H26491 Other secondary cataract, right eye: Secondary | ICD-10-CM | POA: Diagnosis not present

## 2017-10-25 DIAGNOSIS — I10 Essential (primary) hypertension: Secondary | ICD-10-CM | POA: Diagnosis not present

## 2017-10-25 DIAGNOSIS — H52223 Regular astigmatism, bilateral: Secondary | ICD-10-CM | POA: Diagnosis not present

## 2017-10-25 DIAGNOSIS — H43813 Vitreous degeneration, bilateral: Secondary | ICD-10-CM | POA: Diagnosis not present

## 2017-10-25 DIAGNOSIS — H5213 Myopia, bilateral: Secondary | ICD-10-CM | POA: Diagnosis not present

## 2017-10-25 DIAGNOSIS — Z961 Presence of intraocular lens: Secondary | ICD-10-CM | POA: Diagnosis not present

## 2017-10-25 DIAGNOSIS — H524 Presbyopia: Secondary | ICD-10-CM | POA: Diagnosis not present

## 2018-01-16 DIAGNOSIS — N3281 Overactive bladder: Secondary | ICD-10-CM | POA: Diagnosis not present

## 2018-01-16 DIAGNOSIS — N952 Postmenopausal atrophic vaginitis: Secondary | ICD-10-CM | POA: Diagnosis not present

## 2018-01-26 DIAGNOSIS — I10 Essential (primary) hypertension: Secondary | ICD-10-CM | POA: Diagnosis not present

## 2018-01-26 DIAGNOSIS — E890 Postprocedural hypothyroidism: Secondary | ICD-10-CM | POA: Diagnosis not present

## 2018-01-26 DIAGNOSIS — E785 Hyperlipidemia, unspecified: Secondary | ICD-10-CM | POA: Diagnosis not present

## 2018-01-26 DIAGNOSIS — M3320 Polymyositis, organ involvement unspecified: Secondary | ICD-10-CM | POA: Diagnosis not present

## 2018-02-27 DIAGNOSIS — E890 Postprocedural hypothyroidism: Secondary | ICD-10-CM | POA: Diagnosis not present

## 2018-03-15 DIAGNOSIS — Z1231 Encounter for screening mammogram for malignant neoplasm of breast: Secondary | ICD-10-CM | POA: Diagnosis not present

## 2018-03-15 DIAGNOSIS — Z13820 Encounter for screening for osteoporosis: Secondary | ICD-10-CM | POA: Diagnosis not present

## 2018-03-15 DIAGNOSIS — M8589 Other specified disorders of bone density and structure, multiple sites: Secondary | ICD-10-CM | POA: Diagnosis not present

## 2018-04-06 DIAGNOSIS — R233 Spontaneous ecchymoses: Secondary | ICD-10-CM | POA: Diagnosis not present

## 2018-04-06 DIAGNOSIS — I7300 Raynaud's syndrome without gangrene: Secondary | ICD-10-CM | POA: Diagnosis not present

## 2018-04-06 DIAGNOSIS — M3322 Polymyositis with myopathy: Secondary | ICD-10-CM | POA: Diagnosis not present

## 2018-04-06 DIAGNOSIS — R296 Repeated falls: Secondary | ICD-10-CM | POA: Diagnosis not present

## 2018-04-06 DIAGNOSIS — M5136 Other intervertebral disc degeneration, lumbar region: Secondary | ICD-10-CM | POA: Diagnosis not present

## 2018-04-06 DIAGNOSIS — Z6821 Body mass index (BMI) 21.0-21.9, adult: Secondary | ICD-10-CM | POA: Diagnosis not present

## 2018-04-06 DIAGNOSIS — R1310 Dysphagia, unspecified: Secondary | ICD-10-CM | POA: Diagnosis not present

## 2018-04-06 DIAGNOSIS — M150 Primary generalized (osteo)arthritis: Secondary | ICD-10-CM | POA: Diagnosis not present

## 2018-04-06 DIAGNOSIS — Z79899 Other long term (current) drug therapy: Secondary | ICD-10-CM | POA: Diagnosis not present

## 2018-04-06 DIAGNOSIS — R634 Abnormal weight loss: Secondary | ICD-10-CM | POA: Diagnosis not present

## 2018-04-06 DIAGNOSIS — H04123 Dry eye syndrome of bilateral lacrimal glands: Secondary | ICD-10-CM | POA: Diagnosis not present

## 2018-07-19 DIAGNOSIS — N3281 Overactive bladder: Secondary | ICD-10-CM | POA: Diagnosis not present

## 2018-07-19 DIAGNOSIS — N952 Postmenopausal atrophic vaginitis: Secondary | ICD-10-CM | POA: Diagnosis not present

## 2018-08-01 DIAGNOSIS — M3320 Polymyositis, organ involvement unspecified: Secondary | ICD-10-CM | POA: Diagnosis not present

## 2018-08-01 DIAGNOSIS — I10 Essential (primary) hypertension: Secondary | ICD-10-CM | POA: Diagnosis not present

## 2018-08-01 DIAGNOSIS — E785 Hyperlipidemia, unspecified: Secondary | ICD-10-CM | POA: Diagnosis not present

## 2018-08-01 DIAGNOSIS — E890 Postprocedural hypothyroidism: Secondary | ICD-10-CM | POA: Diagnosis not present

## 2018-09-19 DIAGNOSIS — Z1231 Encounter for screening mammogram for malignant neoplasm of breast: Secondary | ICD-10-CM | POA: Diagnosis not present

## 2018-09-19 DIAGNOSIS — E785 Hyperlipidemia, unspecified: Secondary | ICD-10-CM | POA: Diagnosis not present

## 2018-09-19 DIAGNOSIS — Z0000 Encounter for general adult medical examination without abnormal findings: Secondary | ICD-10-CM | POA: Diagnosis not present

## 2018-09-19 DIAGNOSIS — Z1331 Encounter for screening for depression: Secondary | ICD-10-CM | POA: Diagnosis not present

## 2018-09-19 DIAGNOSIS — Z9181 History of falling: Secondary | ICD-10-CM | POA: Diagnosis not present

## 2018-09-29 DIAGNOSIS — M3320 Polymyositis, organ involvement unspecified: Secondary | ICD-10-CM | POA: Diagnosis not present

## 2018-10-17 DIAGNOSIS — N3281 Overactive bladder: Secondary | ICD-10-CM | POA: Diagnosis not present

## 2018-10-17 DIAGNOSIS — N952 Postmenopausal atrophic vaginitis: Secondary | ICD-10-CM | POA: Diagnosis not present

## 2018-11-29 DIAGNOSIS — I7300 Raynaud's syndrome without gangrene: Secondary | ICD-10-CM | POA: Diagnosis not present

## 2018-11-29 DIAGNOSIS — Z79899 Other long term (current) drug therapy: Secondary | ICD-10-CM | POA: Diagnosis not present

## 2018-11-29 DIAGNOSIS — R1310 Dysphagia, unspecified: Secondary | ICD-10-CM | POA: Diagnosis not present

## 2018-11-29 DIAGNOSIS — R233 Spontaneous ecchymoses: Secondary | ICD-10-CM | POA: Diagnosis not present

## 2018-11-29 DIAGNOSIS — M3322 Polymyositis with myopathy: Secondary | ICD-10-CM | POA: Diagnosis not present

## 2018-11-29 DIAGNOSIS — M150 Primary generalized (osteo)arthritis: Secondary | ICD-10-CM | POA: Diagnosis not present

## 2018-11-29 DIAGNOSIS — H04123 Dry eye syndrome of bilateral lacrimal glands: Secondary | ICD-10-CM | POA: Diagnosis not present

## 2018-11-29 DIAGNOSIS — Z6820 Body mass index (BMI) 20.0-20.9, adult: Secondary | ICD-10-CM | POA: Diagnosis not present

## 2018-11-29 DIAGNOSIS — R634 Abnormal weight loss: Secondary | ICD-10-CM | POA: Diagnosis not present

## 2018-11-29 DIAGNOSIS — M5136 Other intervertebral disc degeneration, lumbar region: Secondary | ICD-10-CM | POA: Diagnosis not present

## 2018-11-29 DIAGNOSIS — R296 Repeated falls: Secondary | ICD-10-CM | POA: Diagnosis not present

## 2019-01-30 DIAGNOSIS — I10 Essential (primary) hypertension: Secondary | ICD-10-CM | POA: Diagnosis not present

## 2019-01-30 DIAGNOSIS — Z139 Encounter for screening, unspecified: Secondary | ICD-10-CM | POA: Diagnosis not present

## 2019-01-30 DIAGNOSIS — E890 Postprocedural hypothyroidism: Secondary | ICD-10-CM | POA: Diagnosis not present

## 2019-01-30 DIAGNOSIS — E785 Hyperlipidemia, unspecified: Secondary | ICD-10-CM | POA: Diagnosis not present

## 2019-01-30 DIAGNOSIS — M3320 Polymyositis, organ involvement unspecified: Secondary | ICD-10-CM | POA: Diagnosis not present

## 2019-03-01 DIAGNOSIS — N3281 Overactive bladder: Secondary | ICD-10-CM | POA: Diagnosis not present

## 2019-03-01 DIAGNOSIS — N952 Postmenopausal atrophic vaginitis: Secondary | ICD-10-CM | POA: Diagnosis not present

## 2019-03-07 DIAGNOSIS — Z23 Encounter for immunization: Secondary | ICD-10-CM | POA: Diagnosis not present

## 2019-03-20 DIAGNOSIS — Z1231 Encounter for screening mammogram for malignant neoplasm of breast: Secondary | ICD-10-CM | POA: Diagnosis not present

## 2019-08-03 DIAGNOSIS — M3320 Polymyositis, organ involvement unspecified: Secondary | ICD-10-CM | POA: Diagnosis not present

## 2019-08-03 DIAGNOSIS — E890 Postprocedural hypothyroidism: Secondary | ICD-10-CM | POA: Diagnosis not present

## 2019-08-03 DIAGNOSIS — E785 Hyperlipidemia, unspecified: Secondary | ICD-10-CM | POA: Diagnosis not present

## 2019-08-03 DIAGNOSIS — I10 Essential (primary) hypertension: Secondary | ICD-10-CM | POA: Diagnosis not present

## 2019-08-29 DIAGNOSIS — N952 Postmenopausal atrophic vaginitis: Secondary | ICD-10-CM | POA: Diagnosis not present

## 2019-08-29 DIAGNOSIS — R32 Unspecified urinary incontinence: Secondary | ICD-10-CM | POA: Diagnosis not present

## 2019-09-11 DIAGNOSIS — R233 Spontaneous ecchymoses: Secondary | ICD-10-CM | POA: Diagnosis not present

## 2019-09-11 DIAGNOSIS — M3322 Polymyositis with myopathy: Secondary | ICD-10-CM | POA: Diagnosis not present

## 2019-09-11 DIAGNOSIS — R634 Abnormal weight loss: Secondary | ICD-10-CM | POA: Diagnosis not present

## 2019-09-11 DIAGNOSIS — I7300 Raynaud's syndrome without gangrene: Secondary | ICD-10-CM | POA: Diagnosis not present

## 2019-09-11 DIAGNOSIS — R1310 Dysphagia, unspecified: Secondary | ICD-10-CM | POA: Diagnosis not present

## 2019-09-11 DIAGNOSIS — R296 Repeated falls: Secondary | ICD-10-CM | POA: Diagnosis not present

## 2019-09-11 DIAGNOSIS — Z681 Body mass index (BMI) 19 or less, adult: Secondary | ICD-10-CM | POA: Diagnosis not present

## 2019-09-11 DIAGNOSIS — M150 Primary generalized (osteo)arthritis: Secondary | ICD-10-CM | POA: Diagnosis not present

## 2019-09-11 DIAGNOSIS — H04123 Dry eye syndrome of bilateral lacrimal glands: Secondary | ICD-10-CM | POA: Diagnosis not present

## 2019-09-11 DIAGNOSIS — Z79899 Other long term (current) drug therapy: Secondary | ICD-10-CM | POA: Diagnosis not present

## 2019-09-11 DIAGNOSIS — M5136 Other intervertebral disc degeneration, lumbar region: Secondary | ICD-10-CM | POA: Diagnosis not present

## 2019-09-20 DIAGNOSIS — Z0000 Encounter for general adult medical examination without abnormal findings: Secondary | ICD-10-CM | POA: Diagnosis not present

## 2019-09-20 DIAGNOSIS — Z9181 History of falling: Secondary | ICD-10-CM | POA: Diagnosis not present

## 2019-09-20 DIAGNOSIS — Z1331 Encounter for screening for depression: Secondary | ICD-10-CM | POA: Diagnosis not present

## 2019-09-20 DIAGNOSIS — Z1231 Encounter for screening mammogram for malignant neoplasm of breast: Secondary | ICD-10-CM | POA: Diagnosis not present

## 2019-09-20 DIAGNOSIS — E785 Hyperlipidemia, unspecified: Secondary | ICD-10-CM | POA: Diagnosis not present

## 2019-10-04 DIAGNOSIS — R1013 Epigastric pain: Secondary | ICD-10-CM | POA: Diagnosis not present

## 2019-10-04 DIAGNOSIS — Z20828 Contact with and (suspected) exposure to other viral communicable diseases: Secondary | ICD-10-CM | POA: Diagnosis not present

## 2019-10-04 DIAGNOSIS — I499 Cardiac arrhythmia, unspecified: Secondary | ICD-10-CM | POA: Diagnosis not present

## 2019-10-04 DIAGNOSIS — K219 Gastro-esophageal reflux disease without esophagitis: Secondary | ICD-10-CM | POA: Diagnosis not present

## 2019-10-04 DIAGNOSIS — Z1159 Encounter for screening for other viral diseases: Secondary | ICD-10-CM | POA: Diagnosis not present

## 2019-10-11 DIAGNOSIS — Z79899 Other long term (current) drug therapy: Secondary | ICD-10-CM | POA: Diagnosis not present

## 2019-10-11 DIAGNOSIS — I10 Essential (primary) hypertension: Secondary | ICD-10-CM | POA: Diagnosis not present

## 2019-10-11 DIAGNOSIS — Z96651 Presence of right artificial knee joint: Secondary | ICD-10-CM | POA: Diagnosis not present

## 2019-10-11 DIAGNOSIS — K222 Esophageal obstruction: Secondary | ICD-10-CM | POA: Diagnosis not present

## 2019-10-11 DIAGNOSIS — K259 Gastric ulcer, unspecified as acute or chronic, without hemorrhage or perforation: Secondary | ICD-10-CM | POA: Diagnosis not present

## 2019-10-11 DIAGNOSIS — R1310 Dysphagia, unspecified: Secondary | ICD-10-CM | POA: Diagnosis not present

## 2019-10-11 DIAGNOSIS — K3189 Other diseases of stomach and duodenum: Secondary | ICD-10-CM | POA: Diagnosis not present

## 2019-10-11 DIAGNOSIS — R634 Abnormal weight loss: Secondary | ICD-10-CM | POA: Diagnosis not present

## 2019-10-11 DIAGNOSIS — K449 Diaphragmatic hernia without obstruction or gangrene: Secondary | ICD-10-CM | POA: Diagnosis not present

## 2019-10-11 HISTORY — PX: ESOPHAGOGASTRODUODENOSCOPY: SHX1529

## 2019-12-24 DIAGNOSIS — K222 Esophageal obstruction: Secondary | ICD-10-CM | POA: Diagnosis not present

## 2019-12-24 DIAGNOSIS — R634 Abnormal weight loss: Secondary | ICD-10-CM | POA: Diagnosis not present

## 2019-12-24 DIAGNOSIS — R1310 Dysphagia, unspecified: Secondary | ICD-10-CM | POA: Diagnosis not present

## 2019-12-28 ENCOUNTER — Encounter: Payer: Self-pay | Admitting: Sports Medicine

## 2019-12-28 ENCOUNTER — Ambulatory Visit (INDEPENDENT_AMBULATORY_CARE_PROVIDER_SITE_OTHER): Payer: Medicare Other | Admitting: Sports Medicine

## 2019-12-28 ENCOUNTER — Other Ambulatory Visit: Payer: Self-pay

## 2019-12-28 ENCOUNTER — Other Ambulatory Visit: Payer: Self-pay | Admitting: Sports Medicine

## 2019-12-28 DIAGNOSIS — K224 Dyskinesia of esophagus: Secondary | ICD-10-CM | POA: Diagnosis not present

## 2019-12-28 DIAGNOSIS — M79671 Pain in right foot: Secondary | ICD-10-CM

## 2019-12-28 DIAGNOSIS — Q828 Other specified congenital malformations of skin: Secondary | ICD-10-CM | POA: Diagnosis not present

## 2019-12-28 DIAGNOSIS — M216X1 Other acquired deformities of right foot: Secondary | ICD-10-CM

## 2019-12-28 DIAGNOSIS — R1310 Dysphagia, unspecified: Secondary | ICD-10-CM | POA: Diagnosis not present

## 2019-12-28 DIAGNOSIS — L909 Atrophic disorder of skin, unspecified: Secondary | ICD-10-CM

## 2019-12-28 DIAGNOSIS — M21621 Bunionette of right foot: Secondary | ICD-10-CM | POA: Diagnosis not present

## 2019-12-28 NOTE — Progress Notes (Signed)
Subjective: [name] is a 74 y.o. female patient who presents to office for evaluation of Right foot pain secondary to callus skin. Patient complains of pain at the lesion present Right foot at the ball under the 5th toe joint, Patient has tried peeling it off with her nail with a little relief in symptoms. Pain getting worse over the last 6 months and worse with out shoes 8/10 soreness. Patient denies any other pedal complaints.   Patient is assisted by Daughter this visit.   There are no problems to display for this patient.   Current Outpatient Medications on File Prior to Visit  Medication Sig Dispense Refill   Biotin 10000 MCG TABS Take by mouth.     Calcium Carbonate-Vit D-Min (CALCIUM 600+D PLUS MINERALS PO) Take by mouth.     cholecalciferol (VITAMIN D) 25 MCG (1000 UNIT) tablet Take 1,000 Units by mouth daily.     levothyroxine (SYNTHROID) 150 MCG tablet Take 150 mcg by mouth daily before breakfast.     methotrexate 2.5 MG tablet Take by mouth once a week. Caution:Chemotherapy. Protect from light.     Multiple Vitamin (MULTIVITAMINS PO) Take by mouth.     amLODipine (NORVASC) 5 MG tablet Take 5 mg by mouth daily.     omeprazole (PRILOSEC) 40 MG capsule Take 40 mg by mouth every morning.     tolterodine (DETROL LA) 4 MG 24 hr capsule Take 4 mg by mouth daily.     YUVAFEM 10 MCG TABS vaginal tablet Place 1 tablet vaginally once a week.     No current facility-administered medications on file prior to visit.    Not on File  Objective:  General: Alert and oriented x3 in no acute distress  Dermatology: Keratotic lesion present sub met 5 on right with skin lines transversing the lesion, pain is present with direct pressure to the lesion with a central nucleated core noted, no webspace macerations, no ecchymosis bilateral, all nails x 10 are well manicured.  Vascular: Dorsalis Pedis and Posterior Tibial pedal pulses 1/4, Capillary Fill Time 5 seconds, + pedal hair  growth bilateral, varcosiities, trace edema bilateral lower extremities, Temperature gradient within normal limits.  Neurology: Johney Maine sensation intact via light touch bilateral.  Musculoskeletal: Mild tenderness with palpation at the keratotic lesion site on Right with tailors bunion/prominent met head noted, Muscular strength 5/5 in all groups without major pain or limitation on range of motion.  Assessment and Plan: Problem List Items Addressed This Visit    None    Visit Diagnoses    Porokeratosis    -  Primary   Tailor's bunion of right foot       Prominent metatarsal head of right foot       Fat pad atrophy of foot       Right foot pain          -Complete examination performed -Discussed treatment options -Parred keratoic lesion using a chisel blade x1 on right; treated the area with Salinocaine covered with bandaid -Encouraged daily skin emollients; sample of foot miracle cream provided -Encouraged use of pumice stone -Advised good supportive shoes and inserts and u shaped pads as dispensed -Patient to return to office as needed or sooner if condition worsens.  Landis Martins, DPM

## 2019-12-31 ENCOUNTER — Other Ambulatory Visit: Payer: Self-pay | Admitting: Podiatry

## 2019-12-31 DIAGNOSIS — M79671 Pain in right foot: Secondary | ICD-10-CM

## 2020-01-08 DIAGNOSIS — R1310 Dysphagia, unspecified: Secondary | ICD-10-CM | POA: Diagnosis not present

## 2020-01-31 DIAGNOSIS — Z1231 Encounter for screening mammogram for malignant neoplasm of breast: Secondary | ICD-10-CM | POA: Diagnosis not present

## 2020-01-31 DIAGNOSIS — I10 Essential (primary) hypertension: Secondary | ICD-10-CM | POA: Diagnosis not present

## 2020-01-31 DIAGNOSIS — E785 Hyperlipidemia, unspecified: Secondary | ICD-10-CM | POA: Diagnosis not present

## 2020-01-31 DIAGNOSIS — M3320 Polymyositis, organ involvement unspecified: Secondary | ICD-10-CM | POA: Diagnosis not present

## 2020-01-31 DIAGNOSIS — E890 Postprocedural hypothyroidism: Secondary | ICD-10-CM | POA: Diagnosis not present

## 2020-02-21 DIAGNOSIS — R1310 Dysphagia, unspecified: Secondary | ICD-10-CM | POA: Diagnosis not present

## 2020-02-21 DIAGNOSIS — K222 Esophageal obstruction: Secondary | ICD-10-CM | POA: Diagnosis not present

## 2020-02-21 DIAGNOSIS — R634 Abnormal weight loss: Secondary | ICD-10-CM | POA: Diagnosis not present

## 2020-02-25 DIAGNOSIS — N858 Other specified noninflammatory disorders of uterus: Secondary | ICD-10-CM | POA: Diagnosis not present

## 2020-02-25 DIAGNOSIS — N838 Other noninflammatory disorders of ovary, fallopian tube and broad ligament: Secondary | ICD-10-CM | POA: Diagnosis not present

## 2020-02-25 DIAGNOSIS — N281 Cyst of kidney, acquired: Secondary | ICD-10-CM | POA: Diagnosis not present

## 2020-02-25 DIAGNOSIS — R634 Abnormal weight loss: Secondary | ICD-10-CM | POA: Diagnosis not present

## 2020-02-25 DIAGNOSIS — R630 Anorexia: Secondary | ICD-10-CM | POA: Diagnosis not present

## 2020-03-05 DIAGNOSIS — N838 Other noninflammatory disorders of ovary, fallopian tube and broad ligament: Secondary | ICD-10-CM | POA: Diagnosis not present

## 2020-03-05 DIAGNOSIS — Z780 Asymptomatic menopausal state: Secondary | ICD-10-CM | POA: Diagnosis not present

## 2020-03-05 DIAGNOSIS — D3911 Neoplasm of uncertain behavior of right ovary: Secondary | ICD-10-CM | POA: Diagnosis not present

## 2020-03-05 DIAGNOSIS — R9389 Abnormal findings on diagnostic imaging of other specified body structures: Secondary | ICD-10-CM | POA: Diagnosis not present

## 2020-03-05 DIAGNOSIS — N83201 Unspecified ovarian cyst, right side: Secondary | ICD-10-CM | POA: Diagnosis not present

## 2020-03-05 DIAGNOSIS — D3912 Neoplasm of uncertain behavior of left ovary: Secondary | ICD-10-CM | POA: Diagnosis not present

## 2020-03-05 DIAGNOSIS — R935 Abnormal findings on diagnostic imaging of other abdominal regions, including retroperitoneum: Secondary | ICD-10-CM | POA: Diagnosis not present

## 2020-03-05 DIAGNOSIS — R634 Abnormal weight loss: Secondary | ICD-10-CM | POA: Diagnosis not present

## 2020-03-06 DIAGNOSIS — R1310 Dysphagia, unspecified: Secondary | ICD-10-CM | POA: Diagnosis not present

## 2020-03-10 DIAGNOSIS — N9489 Other specified conditions associated with female genital organs and menstrual cycle: Secondary | ICD-10-CM | POA: Diagnosis not present

## 2020-03-12 DIAGNOSIS — I498 Other specified cardiac arrhythmias: Secondary | ICD-10-CM | POA: Diagnosis not present

## 2020-03-12 DIAGNOSIS — T17908D Unspecified foreign body in respiratory tract, part unspecified causing other injury, subsequent encounter: Secondary | ICD-10-CM | POA: Diagnosis not present

## 2020-03-12 DIAGNOSIS — N9489 Other specified conditions associated with female genital organs and menstrual cycle: Secondary | ICD-10-CM | POA: Diagnosis not present

## 2020-03-12 DIAGNOSIS — E890 Postprocedural hypothyroidism: Secondary | ICD-10-CM | POA: Diagnosis not present

## 2020-03-17 DIAGNOSIS — N9489 Other specified conditions associated with female genital organs and menstrual cycle: Secondary | ICD-10-CM

## 2020-03-17 HISTORY — DX: Other specified conditions associated with female genital organs and menstrual cycle: N94.89

## 2020-03-24 DIAGNOSIS — R928 Other abnormal and inconclusive findings on diagnostic imaging of breast: Secondary | ICD-10-CM | POA: Diagnosis not present

## 2020-03-24 DIAGNOSIS — Z1231 Encounter for screening mammogram for malignant neoplasm of breast: Secondary | ICD-10-CM | POA: Diagnosis not present

## 2020-03-28 DIAGNOSIS — N9489 Other specified conditions associated with female genital organs and menstrual cycle: Secondary | ICD-10-CM | POA: Diagnosis not present

## 2020-03-28 DIAGNOSIS — R1900 Intra-abdominal and pelvic swelling, mass and lump, unspecified site: Secondary | ICD-10-CM | POA: Diagnosis not present

## 2020-03-28 DIAGNOSIS — N838 Other noninflammatory disorders of ovary, fallopian tube and broad ligament: Secondary | ICD-10-CM | POA: Diagnosis not present

## 2020-03-28 DIAGNOSIS — D279 Benign neoplasm of unspecified ovary: Secondary | ICD-10-CM | POA: Diagnosis not present

## 2020-03-28 DIAGNOSIS — D282 Benign neoplasm of uterine tubes and ligaments: Secondary | ICD-10-CM | POA: Diagnosis not present

## 2020-03-28 DIAGNOSIS — D271 Benign neoplasm of left ovary: Secondary | ICD-10-CM | POA: Diagnosis not present

## 2020-03-28 DIAGNOSIS — D270 Benign neoplasm of right ovary: Secondary | ICD-10-CM | POA: Diagnosis not present

## 2020-04-03 DIAGNOSIS — R634 Abnormal weight loss: Secondary | ICD-10-CM | POA: Diagnosis not present

## 2020-04-03 DIAGNOSIS — R296 Repeated falls: Secondary | ICD-10-CM | POA: Diagnosis not present

## 2020-04-03 DIAGNOSIS — Z79899 Other long term (current) drug therapy: Secondary | ICD-10-CM | POA: Diagnosis not present

## 2020-04-03 DIAGNOSIS — R1310 Dysphagia, unspecified: Secondary | ICD-10-CM | POA: Diagnosis not present

## 2020-04-03 DIAGNOSIS — M3322 Polymyositis with myopathy: Secondary | ICD-10-CM | POA: Diagnosis not present

## 2020-04-03 DIAGNOSIS — H04123 Dry eye syndrome of bilateral lacrimal glands: Secondary | ICD-10-CM | POA: Diagnosis not present

## 2020-04-03 DIAGNOSIS — I7300 Raynaud's syndrome without gangrene: Secondary | ICD-10-CM | POA: Diagnosis not present

## 2020-04-03 DIAGNOSIS — M5136 Other intervertebral disc degeneration, lumbar region: Secondary | ICD-10-CM | POA: Diagnosis not present

## 2020-04-03 DIAGNOSIS — M150 Primary generalized (osteo)arthritis: Secondary | ICD-10-CM | POA: Diagnosis not present

## 2020-04-03 DIAGNOSIS — Z681 Body mass index (BMI) 19 or less, adult: Secondary | ICD-10-CM | POA: Diagnosis not present

## 2020-04-03 DIAGNOSIS — R233 Spontaneous ecchymoses: Secondary | ICD-10-CM | POA: Diagnosis not present

## 2020-04-05 DIAGNOSIS — Z23 Encounter for immunization: Secondary | ICD-10-CM | POA: Diagnosis not present

## 2020-04-08 DIAGNOSIS — Z90722 Acquired absence of ovaries, bilateral: Secondary | ICD-10-CM | POA: Diagnosis not present

## 2020-04-08 DIAGNOSIS — R1909 Other intra-abdominal and pelvic swelling, mass and lump: Secondary | ICD-10-CM | POA: Diagnosis not present

## 2020-04-08 DIAGNOSIS — Z9079 Acquired absence of other genital organ(s): Secondary | ICD-10-CM | POA: Diagnosis not present

## 2020-04-08 DIAGNOSIS — N9489 Other specified conditions associated with female genital organs and menstrual cycle: Secondary | ICD-10-CM | POA: Diagnosis not present

## 2020-04-10 DIAGNOSIS — R928 Other abnormal and inconclusive findings on diagnostic imaging of breast: Secondary | ICD-10-CM | POA: Diagnosis not present

## 2020-04-14 DIAGNOSIS — R32 Unspecified urinary incontinence: Secondary | ICD-10-CM | POA: Diagnosis not present

## 2020-04-14 DIAGNOSIS — N952 Postmenopausal atrophic vaginitis: Secondary | ICD-10-CM | POA: Diagnosis not present

## 2020-05-15 DIAGNOSIS — N952 Postmenopausal atrophic vaginitis: Secondary | ICD-10-CM | POA: Diagnosis not present

## 2020-05-15 DIAGNOSIS — R32 Unspecified urinary incontinence: Secondary | ICD-10-CM | POA: Diagnosis not present

## 2020-08-04 DIAGNOSIS — I10 Essential (primary) hypertension: Secondary | ICD-10-CM | POA: Diagnosis not present

## 2020-08-04 DIAGNOSIS — E890 Postprocedural hypothyroidism: Secondary | ICD-10-CM | POA: Diagnosis not present

## 2020-08-04 DIAGNOSIS — E785 Hyperlipidemia, unspecified: Secondary | ICD-10-CM | POA: Diagnosis not present

## 2020-08-04 DIAGNOSIS — M3320 Polymyositis, organ involvement unspecified: Secondary | ICD-10-CM | POA: Diagnosis not present

## 2020-08-28 DIAGNOSIS — R32 Unspecified urinary incontinence: Secondary | ICD-10-CM | POA: Diagnosis not present

## 2020-08-28 DIAGNOSIS — N952 Postmenopausal atrophic vaginitis: Secondary | ICD-10-CM | POA: Diagnosis not present

## 2020-09-24 DIAGNOSIS — Z0000 Encounter for general adult medical examination without abnormal findings: Secondary | ICD-10-CM | POA: Diagnosis not present

## 2020-09-24 DIAGNOSIS — Z139 Encounter for screening, unspecified: Secondary | ICD-10-CM | POA: Diagnosis not present

## 2020-09-24 DIAGNOSIS — E785 Hyperlipidemia, unspecified: Secondary | ICD-10-CM | POA: Diagnosis not present

## 2020-09-24 DIAGNOSIS — Z1331 Encounter for screening for depression: Secondary | ICD-10-CM | POA: Diagnosis not present

## 2020-09-24 DIAGNOSIS — Z9181 History of falling: Secondary | ICD-10-CM | POA: Diagnosis not present

## 2020-10-07 DIAGNOSIS — R32 Unspecified urinary incontinence: Secondary | ICD-10-CM | POA: Diagnosis not present

## 2020-10-07 DIAGNOSIS — N952 Postmenopausal atrophic vaginitis: Secondary | ICD-10-CM | POA: Diagnosis not present

## 2020-10-08 DIAGNOSIS — R233 Spontaneous ecchymoses: Secondary | ICD-10-CM | POA: Diagnosis not present

## 2020-10-08 DIAGNOSIS — R1310 Dysphagia, unspecified: Secondary | ICD-10-CM | POA: Diagnosis not present

## 2020-10-08 DIAGNOSIS — Z79899 Other long term (current) drug therapy: Secondary | ICD-10-CM | POA: Diagnosis not present

## 2020-10-08 DIAGNOSIS — M3322 Polymyositis with myopathy: Secondary | ICD-10-CM | POA: Diagnosis not present

## 2020-10-08 DIAGNOSIS — R634 Abnormal weight loss: Secondary | ICD-10-CM | POA: Diagnosis not present

## 2020-10-08 DIAGNOSIS — M5136 Other intervertebral disc degeneration, lumbar region: Secondary | ICD-10-CM | POA: Diagnosis not present

## 2020-10-08 DIAGNOSIS — M150 Primary generalized (osteo)arthritis: Secondary | ICD-10-CM | POA: Diagnosis not present

## 2020-10-08 DIAGNOSIS — Z681 Body mass index (BMI) 19 or less, adult: Secondary | ICD-10-CM | POA: Diagnosis not present

## 2020-10-08 DIAGNOSIS — R296 Repeated falls: Secondary | ICD-10-CM | POA: Diagnosis not present

## 2020-10-08 DIAGNOSIS — M7989 Other specified soft tissue disorders: Secondary | ICD-10-CM | POA: Diagnosis not present

## 2020-10-08 DIAGNOSIS — I7300 Raynaud's syndrome without gangrene: Secondary | ICD-10-CM | POA: Diagnosis not present

## 2020-10-08 DIAGNOSIS — H04123 Dry eye syndrome of bilateral lacrimal glands: Secondary | ICD-10-CM | POA: Diagnosis not present

## 2021-01-21 DIAGNOSIS — H04123 Dry eye syndrome of bilateral lacrimal glands: Secondary | ICD-10-CM | POA: Diagnosis not present

## 2021-01-21 DIAGNOSIS — Z681 Body mass index (BMI) 19 or less, adult: Secondary | ICD-10-CM | POA: Diagnosis not present

## 2021-01-21 DIAGNOSIS — R296 Repeated falls: Secondary | ICD-10-CM | POA: Diagnosis not present

## 2021-01-21 DIAGNOSIS — M5136 Other intervertebral disc degeneration, lumbar region: Secondary | ICD-10-CM | POA: Diagnosis not present

## 2021-01-21 DIAGNOSIS — Z79899 Other long term (current) drug therapy: Secondary | ICD-10-CM | POA: Diagnosis not present

## 2021-01-21 DIAGNOSIS — M3322 Polymyositis with myopathy: Secondary | ICD-10-CM | POA: Diagnosis not present

## 2021-01-21 DIAGNOSIS — I7300 Raynaud's syndrome without gangrene: Secondary | ICD-10-CM | POA: Diagnosis not present

## 2021-01-21 DIAGNOSIS — R1310 Dysphagia, unspecified: Secondary | ICD-10-CM | POA: Diagnosis not present

## 2021-01-21 DIAGNOSIS — M150 Primary generalized (osteo)arthritis: Secondary | ICD-10-CM | POA: Diagnosis not present

## 2021-01-21 DIAGNOSIS — R634 Abnormal weight loss: Secondary | ICD-10-CM | POA: Diagnosis not present

## 2021-01-23 DIAGNOSIS — M3320 Polymyositis, organ involvement unspecified: Secondary | ICD-10-CM | POA: Diagnosis not present

## 2021-01-23 DIAGNOSIS — R634 Abnormal weight loss: Secondary | ICD-10-CM | POA: Diagnosis not present

## 2021-01-23 DIAGNOSIS — E785 Hyperlipidemia, unspecified: Secondary | ICD-10-CM | POA: Diagnosis not present

## 2021-01-23 DIAGNOSIS — R1319 Other dysphagia: Secondary | ICD-10-CM | POA: Diagnosis not present

## 2021-01-23 DIAGNOSIS — E890 Postprocedural hypothyroidism: Secondary | ICD-10-CM | POA: Diagnosis not present

## 2021-01-23 DIAGNOSIS — I10 Essential (primary) hypertension: Secondary | ICD-10-CM | POA: Diagnosis not present

## 2021-01-29 DIAGNOSIS — N838 Other noninflammatory disorders of ovary, fallopian tube and broad ligament: Secondary | ICD-10-CM | POA: Diagnosis not present

## 2021-01-29 DIAGNOSIS — R634 Abnormal weight loss: Secondary | ICD-10-CM | POA: Diagnosis not present

## 2021-01-29 DIAGNOSIS — M609 Myositis, unspecified: Secondary | ICD-10-CM | POA: Diagnosis not present

## 2021-01-29 DIAGNOSIS — T17928A Food in respiratory tract, part unspecified causing other injury, initial encounter: Secondary | ICD-10-CM | POA: Diagnosis not present

## 2021-02-25 DIAGNOSIS — Z20822 Contact with and (suspected) exposure to covid-19: Secondary | ICD-10-CM | POA: Diagnosis not present

## 2021-03-11 DIAGNOSIS — R1310 Dysphagia, unspecified: Secondary | ICD-10-CM | POA: Diagnosis not present

## 2021-03-11 DIAGNOSIS — R9341 Abnormal radiologic findings on diagnostic imaging of renal pelvis, ureter, or bladder: Secondary | ICD-10-CM | POA: Diagnosis not present

## 2021-03-12 DIAGNOSIS — Z23 Encounter for immunization: Secondary | ICD-10-CM | POA: Diagnosis not present

## 2021-03-18 DIAGNOSIS — R1310 Dysphagia, unspecified: Secondary | ICD-10-CM | POA: Diagnosis not present

## 2021-03-18 DIAGNOSIS — R948 Abnormal results of function studies of other organs and systems: Secondary | ICD-10-CM | POA: Diagnosis not present

## 2021-03-18 DIAGNOSIS — K224 Dyskinesia of esophagus: Secondary | ICD-10-CM | POA: Diagnosis not present

## 2021-03-31 DIAGNOSIS — M5136 Other intervertebral disc degeneration, lumbar region: Secondary | ICD-10-CM | POA: Diagnosis not present

## 2021-03-31 DIAGNOSIS — H04123 Dry eye syndrome of bilateral lacrimal glands: Secondary | ICD-10-CM | POA: Diagnosis not present

## 2021-03-31 DIAGNOSIS — M3322 Polymyositis with myopathy: Secondary | ICD-10-CM | POA: Diagnosis not present

## 2021-03-31 DIAGNOSIS — R296 Repeated falls: Secondary | ICD-10-CM | POA: Diagnosis not present

## 2021-03-31 DIAGNOSIS — Z79899 Other long term (current) drug therapy: Secondary | ICD-10-CM | POA: Diagnosis not present

## 2021-03-31 DIAGNOSIS — M150 Primary generalized (osteo)arthritis: Secondary | ICD-10-CM | POA: Diagnosis not present

## 2021-03-31 DIAGNOSIS — I7300 Raynaud's syndrome without gangrene: Secondary | ICD-10-CM | POA: Diagnosis not present

## 2021-03-31 DIAGNOSIS — R634 Abnormal weight loss: Secondary | ICD-10-CM | POA: Diagnosis not present

## 2021-03-31 DIAGNOSIS — Z681 Body mass index (BMI) 19 or less, adult: Secondary | ICD-10-CM | POA: Diagnosis not present

## 2021-03-31 DIAGNOSIS — R1310 Dysphagia, unspecified: Secondary | ICD-10-CM | POA: Diagnosis not present

## 2021-04-13 DIAGNOSIS — Z20822 Contact with and (suspected) exposure to covid-19: Secondary | ICD-10-CM | POA: Diagnosis not present

## 2021-04-21 DIAGNOSIS — Z1231 Encounter for screening mammogram for malignant neoplasm of breast: Secondary | ICD-10-CM | POA: Diagnosis not present

## 2021-04-28 DIAGNOSIS — Z79899 Other long term (current) drug therapy: Secondary | ICD-10-CM | POA: Diagnosis not present

## 2021-04-28 DIAGNOSIS — M3322 Polymyositis with myopathy: Secondary | ICD-10-CM | POA: Diagnosis not present

## 2021-05-01 DIAGNOSIS — Z20822 Contact with and (suspected) exposure to covid-19: Secondary | ICD-10-CM | POA: Diagnosis not present

## 2021-05-07 DIAGNOSIS — Z20822 Contact with and (suspected) exposure to covid-19: Secondary | ICD-10-CM | POA: Diagnosis not present

## 2021-05-29 DIAGNOSIS — Z20822 Contact with and (suspected) exposure to covid-19: Secondary | ICD-10-CM | POA: Diagnosis not present

## 2021-06-01 DIAGNOSIS — Z20822 Contact with and (suspected) exposure to covid-19: Secondary | ICD-10-CM | POA: Diagnosis not present

## 2021-06-07 HISTORY — PX: HERNIA REPAIR: SHX51

## 2021-06-08 DIAGNOSIS — Z20822 Contact with and (suspected) exposure to covid-19: Secondary | ICD-10-CM | POA: Diagnosis not present

## 2021-06-10 DIAGNOSIS — Z20822 Contact with and (suspected) exposure to covid-19: Secondary | ICD-10-CM | POA: Diagnosis not present

## 2021-07-07 DIAGNOSIS — Z20822 Contact with and (suspected) exposure to covid-19: Secondary | ICD-10-CM | POA: Diagnosis not present

## 2021-07-09 DIAGNOSIS — M5136 Other intervertebral disc degeneration, lumbar region: Secondary | ICD-10-CM | POA: Diagnosis not present

## 2021-07-09 DIAGNOSIS — Z79899 Other long term (current) drug therapy: Secondary | ICD-10-CM | POA: Diagnosis not present

## 2021-07-09 DIAGNOSIS — R1310 Dysphagia, unspecified: Secondary | ICD-10-CM | POA: Diagnosis not present

## 2021-07-09 DIAGNOSIS — Z681 Body mass index (BMI) 19 or less, adult: Secondary | ICD-10-CM | POA: Diagnosis not present

## 2021-07-09 DIAGNOSIS — I7300 Raynaud's syndrome without gangrene: Secondary | ICD-10-CM | POA: Diagnosis not present

## 2021-07-09 DIAGNOSIS — H04123 Dry eye syndrome of bilateral lacrimal glands: Secondary | ICD-10-CM | POA: Diagnosis not present

## 2021-07-09 DIAGNOSIS — M150 Primary generalized (osteo)arthritis: Secondary | ICD-10-CM | POA: Diagnosis not present

## 2021-07-09 DIAGNOSIS — R296 Repeated falls: Secondary | ICD-10-CM | POA: Diagnosis not present

## 2021-07-09 DIAGNOSIS — M3322 Polymyositis with myopathy: Secondary | ICD-10-CM | POA: Diagnosis not present

## 2021-07-10 DIAGNOSIS — Z20822 Contact with and (suspected) exposure to covid-19: Secondary | ICD-10-CM | POA: Diagnosis not present

## 2021-07-11 DIAGNOSIS — Z20822 Contact with and (suspected) exposure to covid-19: Secondary | ICD-10-CM | POA: Diagnosis not present

## 2021-07-27 DIAGNOSIS — E890 Postprocedural hypothyroidism: Secondary | ICD-10-CM | POA: Diagnosis not present

## 2021-07-27 DIAGNOSIS — M3320 Polymyositis, organ involvement unspecified: Secondary | ICD-10-CM | POA: Diagnosis not present

## 2021-07-27 DIAGNOSIS — R197 Diarrhea, unspecified: Secondary | ICD-10-CM | POA: Diagnosis not present

## 2021-07-27 DIAGNOSIS — E785 Hyperlipidemia, unspecified: Secondary | ICD-10-CM | POA: Diagnosis not present

## 2021-07-27 DIAGNOSIS — I10 Essential (primary) hypertension: Secondary | ICD-10-CM | POA: Diagnosis not present

## 2021-07-27 DIAGNOSIS — R413 Other amnesia: Secondary | ICD-10-CM | POA: Diagnosis not present

## 2021-07-27 DIAGNOSIS — R1319 Other dysphagia: Secondary | ICD-10-CM | POA: Diagnosis not present

## 2021-07-28 DIAGNOSIS — R197 Diarrhea, unspecified: Secondary | ICD-10-CM | POA: Diagnosis not present

## 2021-07-30 DIAGNOSIS — I441 Atrioventricular block, second degree: Secondary | ICD-10-CM | POA: Diagnosis not present

## 2021-07-30 DIAGNOSIS — I459 Conduction disorder, unspecified: Secondary | ICD-10-CM | POA: Diagnosis not present

## 2021-07-30 DIAGNOSIS — R413 Other amnesia: Secondary | ICD-10-CM | POA: Diagnosis not present

## 2021-07-30 DIAGNOSIS — G250 Essential tremor: Secondary | ICD-10-CM | POA: Diagnosis not present

## 2021-07-31 DIAGNOSIS — Z20822 Contact with and (suspected) exposure to covid-19: Secondary | ICD-10-CM | POA: Diagnosis not present

## 2021-08-04 ENCOUNTER — Other Ambulatory Visit: Payer: Self-pay

## 2021-08-04 DIAGNOSIS — M1990 Unspecified osteoarthritis, unspecified site: Secondary | ICD-10-CM | POA: Insufficient documentation

## 2021-08-04 DIAGNOSIS — J329 Chronic sinusitis, unspecified: Secondary | ICD-10-CM | POA: Insufficient documentation

## 2021-08-04 DIAGNOSIS — R1319 Other dysphagia: Secondary | ICD-10-CM | POA: Insufficient documentation

## 2021-08-04 DIAGNOSIS — I441 Atrioventricular block, second degree: Secondary | ICD-10-CM | POA: Insufficient documentation

## 2021-08-04 DIAGNOSIS — I459 Conduction disorder, unspecified: Secondary | ICD-10-CM | POA: Insufficient documentation

## 2021-08-04 DIAGNOSIS — E785 Hyperlipidemia, unspecified: Secondary | ICD-10-CM | POA: Insufficient documentation

## 2021-08-04 DIAGNOSIS — R197 Diarrhea, unspecified: Secondary | ICD-10-CM | POA: Insufficient documentation

## 2021-08-04 DIAGNOSIS — R1310 Dysphagia, unspecified: Secondary | ICD-10-CM | POA: Insufficient documentation

## 2021-08-04 DIAGNOSIS — H8110 Benign paroxysmal vertigo, unspecified ear: Secondary | ICD-10-CM | POA: Insufficient documentation

## 2021-08-04 DIAGNOSIS — M5136 Other intervertebral disc degeneration, lumbar region: Secondary | ICD-10-CM | POA: Insufficient documentation

## 2021-08-04 DIAGNOSIS — I7300 Raynaud's syndrome without gangrene: Secondary | ICD-10-CM | POA: Insufficient documentation

## 2021-08-04 DIAGNOSIS — M3320 Polymyositis, organ involvement unspecified: Secondary | ICD-10-CM | POA: Insufficient documentation

## 2021-08-04 DIAGNOSIS — Z20822 Contact with and (suspected) exposure to covid-19: Secondary | ICD-10-CM | POA: Diagnosis not present

## 2021-08-04 DIAGNOSIS — R413 Other amnesia: Secondary | ICD-10-CM | POA: Insufficient documentation

## 2021-08-04 DIAGNOSIS — E890 Postprocedural hypothyroidism: Secondary | ICD-10-CM | POA: Insufficient documentation

## 2021-08-04 DIAGNOSIS — R251 Tremor, unspecified: Secondary | ICD-10-CM | POA: Insufficient documentation

## 2021-08-04 DIAGNOSIS — I10 Essential (primary) hypertension: Secondary | ICD-10-CM | POA: Insufficient documentation

## 2021-08-04 HISTORY — DX: Dysphagia, unspecified: R13.10

## 2021-08-05 ENCOUNTER — Encounter: Payer: Self-pay | Admitting: Cardiology

## 2021-08-05 ENCOUNTER — Ambulatory Visit (INDEPENDENT_AMBULATORY_CARE_PROVIDER_SITE_OTHER): Payer: Medicare Other | Admitting: Cardiology

## 2021-08-05 ENCOUNTER — Ambulatory Visit (INDEPENDENT_AMBULATORY_CARE_PROVIDER_SITE_OTHER): Payer: Medicare Other

## 2021-08-05 ENCOUNTER — Other Ambulatory Visit: Payer: Self-pay

## 2021-08-05 VITALS — BP 102/56 | HR 76 | Ht 64.0 in | Wt 91.6 lb

## 2021-08-05 DIAGNOSIS — Z20822 Contact with and (suspected) exposure to covid-19: Secondary | ICD-10-CM | POA: Diagnosis not present

## 2021-08-05 DIAGNOSIS — I10 Essential (primary) hypertension: Secondary | ICD-10-CM | POA: Diagnosis not present

## 2021-08-05 DIAGNOSIS — R9431 Abnormal electrocardiogram [ECG] [EKG]: Secondary | ICD-10-CM

## 2021-08-05 DIAGNOSIS — R011 Cardiac murmur, unspecified: Secondary | ICD-10-CM

## 2021-08-05 DIAGNOSIS — R002 Palpitations: Secondary | ICD-10-CM

## 2021-08-05 HISTORY — DX: Abnormal electrocardiogram (ECG) (EKG): R94.31

## 2021-08-05 HISTORY — DX: Cardiac murmur, unspecified: R01.1

## 2021-08-05 NOTE — Progress Notes (Signed)
?Cardiology Office Note:   ? ?Date:  08/05/2021  ? ?ID:  [name], DOB 05-20-1946, MRN [id number] ? ?PCP:  Nicoletta Dress, MD  ?Cardiologist:  Jenean Lindau, MD  ? ?Referring MD: Nicoletta Dress, MD  ? ? ?ASSESSMENT:   ? ?1. Hypertension, essential, benign   ?2. Abnormal EKG   ?3. Cardiac murmur   ? ?PLAN:   ? ?In order of problems listed above: ? ?Primary prevention stressed with the patient.  Importance of compliance with diet medication stressed and she vocalized understanding. ?Essential hypertension: Blood pressure stable and diet was emphasized.  Lifestyle modification urged.  Salt intake issues and diet emphasized. ?Abnormal EKG: Patient has sinus rhythm with PACs and I reassured her about my findings. ?Palpitations: We will do a 2-week monitor to understand her symptoms. ?Cardiac murmur: Echocardiogram will be done to assess murmur heard on auscultation. ?Patient will be seen in follow-up appointment in 6 months or earlier if the patient has any concerns ? ? ? ?Medication Adjustments/Labs and Tests Ordered: ?Current medicines are reviewed at length with the patient today.  Concerns regarding medicines are outlined above.  ?No orders of the defined types were placed in this encounter. ? ?No orders of the defined types were placed in this encounter. ? ? ? ?History of Present Illness:   ? ?[name] is a 76 y.o. female who is being seen today for the evaluation of abnormal EKG with AV conduction abnormalities at the request of Nicoletta Dress, MD. patient is a pleasant 76 year old female.  She has past medical history of essential hypertension.  She saw Dr. Elissa Hefty.  EKG was done and there was a suspicion of AV conduction disturbance and therefore patient was sent for evaluation to [place].  EKG has revealed sinus rhythm with PACs.  No chest pain orthopnea PND dizziness palpitations or syncope or any such issues.  She occasionally has palpitations.  Not related to exertion.  At the time of my  evaluation, the patient is alert awake oriented and in no distress. ? ? ? ?Past Medical History:  ?Diagnosis Date  ? BPPV (benign paroxysmal positional vertigo)   ? Chronic sinusitis   ? DDD (degenerative disc disease), lumbar   ? Diarrhea   ? Esophageal dysphagia   ? Heart block   ? Hyperlipidemia LDL goal <100   ? Hypertension, essential, benign   ? Memory loss   ? Mobitz II   ? Osteoarthritis   ? Polymyositis ([place])   ? Postsurgical hypothyroidism   ? Raynaud's phenomenon   ? Tremor   ? ? ?Past Surgical History:  ?Procedure Laterality Date  ? BILATERAL OOPHORECTOMY    ? CARPAL TUNNEL RELEASE    ? CATARACT EXTRACTION, BILATERAL    ? THYROIDECTOMY    ? TOTAL KNEE ARTHROPLASTY Right   ? TUBAL LIGATION    ? ? ?Current Medications: ?Current Meds  ?Medication Sig  ? amLODipine (NORVASC) 5 MG tablet Take 5 mg by mouth daily.  ? Biotin 10000 MCG TABS Take 10,000 mcg by mouth daily.  ? Calcium Carbonate-Vit D-Min (CALCIUM 600+D PLUS MINERALS PO) Take 1 tablet by mouth daily.  ? cholecalciferol (VITAMIN D) 25 MCG (1000 UNIT) tablet Take 1,000 Units by mouth daily.  ? donepezil (ARICEPT) 5 MG tablet Take 5 mg by mouth at bedtime.  ? folic acid (FOLVITE) 1 MG tablet Take 1 mg by mouth daily.  ? levothyroxine (SYNTHROID) 150 MCG tablet Take 150 mcg by mouth daily before  breakfast.  ? methotrexate 2.5 MG tablet Take 10 mg by mouth once a week. Caution:Chemotherapy. Protect from light.   ? mirtazapine (REMERON) 7.5 MG tablet Take 7.5 mg by mouth at bedtime.  ? Multiple Vitamin (MULTIVITAMINS PO) Take 1 tablet by mouth daily.  ?  ? ?Allergies:   Patient has no known allergies.  ? ?Social History  ? ?Socioeconomic History  ? Marital status: Married  ?  Spouse name: Not on file  ? Number of children: Not on file  ? Years of education: Not on file  ? Highest education level: Not on file  ?Occupational History  ? Not on file  ?Tobacco Use  ? Smoking status: Never  ? Smokeless tobacco: Never  ?Substance and Sexual Activity  ? Alcohol  use: Not on file  ? Drug use: Not on file  ? Sexual activity: Not on file  ?Other Topics Concern  ? Not on file  ?Social History Narrative  ? Not on file  ? ?Social Determinants of Health  ? ?Financial Resource Strain: Not on file  ?Food Insecurity: Not on file  ?Transportation Needs: Not on file  ?Physical Activity: Not on file  ?Stress: Not on file  ?Social Connections: Not on file  ?  ? ?Family History: ?The patient's family history includes Diabetes in her mother; Heart disease in her mother. ? ?ROS:   ?Please see the history of present illness.    ?All other systems reviewed and are negative. ? ?EKGs/Labs/Other Studies Reviewed:   ? ?The following studies were reviewed today: ?EKG reveals sinus rhythm and nonspecific ST-T changes with PACs. ? ? ?Recent Labs: ?No results found for requested labs within last 8760 hours.  ?Recent Lipid Panel ?No results found for: CHOL, TRIG, HDL, CHOLHDL, VLDL, LDLCALC, LDLDIRECT ? ?Physical Exam:   ? ?VS:  BP (!) 102/56   Pulse 76   Ht 5\' 4"  (1.626 m)   Wt 91 lb 9.6 oz (41.5 kg)   SpO2 95%   BMI 15.72 kg/m?    ? ?Wt Readings from Last 3 Encounters:  ?08/05/21 91 lb 9.6 oz (41.5 kg)  ?  ? ?GEN: Patient is in no acute distress ?HEENT: Normal ?NECK: No JVD; No carotid bruits ?LYMPHATICS: No lymphadenopathy ?CARDIAC: S1 S2 regular, 2/6 systolic murmur at the apex. ?RESPIRATORY:  Clear to auscultation without rales, wheezing or rhonchi  ?ABDOMEN: Soft, non-tender, non-distended ?MUSCULOSKELETAL:  No edema; No deformity  ?SKIN: Warm and dry ?NEUROLOGIC:  Alert and oriented x 3 ?PSYCHIATRIC:  Normal affect  ? ? ?Signed, ?Jenean Lindau, MD  ?08/05/2021 1:55 PM    ?[place]   ?

## 2021-08-05 NOTE — Patient Instructions (Signed)
Medication Instructions:  ?Your physician recommends that you continue on your current medications as directed. Please refer to the Current Medication list given to you today. ? ?*If you need a refill on your cardiac medications before your next appointment, please call your pharmacy* ? ? ?Lab Work: ?None ordered ?If you have labs (blood work) drawn today and your tests are completely normal, you will receive your results only by: ?MyChart Message (if you have MyChart) OR ?A paper copy in the mail ?If you have any lab test that is abnormal or we need to change your treatment, we will call you to review the results. ? ? ?Testing/Procedures: ?Your physician has requested that you have an echocardiogram. Echocardiography is a painless test that uses sound waves to create images of your heart. It provides your doctor with information about the size and shape of your heart and how well your heart?s chambers and valves are working. This procedure takes approximately one hour. There are no restrictions for this procedure. ? ? ?WHY IS MY DOCTOR PRESCRIBING ZIO? ?The Zio system is proven and trusted by physicians to detect and diagnose irregular heart rhythms -- and has been prescribed to hundreds of thousands of patients. ? ?The FDA has cleared the Zio system to monitor for many different kinds of irregular heart rhythms. In a study, physicians were able to reach a diagnosis 90% of the time with the Zio system1. ? ?You can wear the Zio monitor -- a small, discreet, comfortable patch -- during your normal day-to-day activity, including while you sleep, shower, and exercise, while it records every single heartbeat for analysis. ? ?1Barrett, P., et al. Comparison of 24 Hour Holter Monitoring Versus 14 Day Novel Adhesive Patch Electrocardiographic Monitoring. [place], 2014. ? ?ZIO VS. HOLTER MONITORING ?The Zio monitor can be comfortably worn for up to 14 days. Holter monitors can be worn for 24 to 48  hours, limiting the time to record any irregular heart rhythms you may have. Zio is able to capture data for the 51% of patients who have their first symptom-triggered arrhythmia after 48 hours.1 ? ?LIVE WITHOUT RESTRICTIONS ?The Zio ambulatory cardiac monitor is a small, unobtrusive, and water-resistant patch--you might even forget you?re wearing it. The Zio monitor records and stores every beat of your heart, whether you're sleeping, working out, or showering. ?Wear the monitor for 14 day, remove 08/19/21. ? ?Follow-Up: ?At [facility], you and your health needs are our priority.  As part of our continuing mission to provide you with exceptional heart care, we have created designated Provider Care Teams.  These Care Teams include your primary Cardiologist (physician) and Advanced Practice Providers (APPs -  Physician Assistants and Nurse Practitioners) who all work together to provide you with the care you need, when you need it. ? ?We recommend signing up for the patient portal called "MyChart".  Sign up information is provided on this After Visit Summary.  MyChart is used to connect with patients for Virtual Visits (Telemedicine).  Patients are able to view lab/test results, encounter notes, upcoming appointments, etc.  Non-urgent messages can be sent to your provider as well.   ?To learn more about what you can do with MyChart, go to [email or web address].   ? ?Your next appointment:   ?6 month(s) ? ?The format for your next appointment:   ?In Person ? ?Provider:   ?Jyl Heinz, MD ? ? ?Other Instructions ?Echocardiogram ?An echocardiogram is a test that uses sound waves (ultrasound) to produce images of  the heart. ?Images from an echocardiogram can provide important information about: ?Heart size and shape. ?The size and thickness and movement of your heart's walls. ?Heart muscle function and strength. ?Heart valve function or if you have stenosis. Stenosis is when the heart valves are too narrow. ?If  blood is flowing backward through the heart valves (regurgitation). ?A tumor or infectious growth around the heart valves. ?Areas of heart muscle that are not working well because of poor blood flow or injury from a heart attack. ?Aneurysm detection. An aneurysm is a weak or damaged part of an artery wall. The wall bulges out from the normal force of blood pumping through the body. ?Tell a health care provider about: ?Any allergies you have. ?All medicines you are taking, including vitamins, herbs, eye drops, creams, and over-the-counter medicines. ?Any blood disorders you have. ?Any surgeries you have had. ?Any medical conditions you have. ?Whether you are pregnant or may be pregnant. ?What are the risks? ?Generally, this is a safe test. However, problems may occur, including an allergic reaction to dye (contrast) that may be used during the test. ?What happens before the test? ?No specific preparation is needed. You may eat and drink normally. ?What happens during the test? ?You will take off your clothes from the waist up and put on a hospital gown. ?Electrodes or electrocardiogram (ECG)patches may be placed on your chest. The electrodes or patches are then connected to a device that monitors your heart rate and rhythm. ?You will lie down on a table for an ultrasound exam. A gel will be applied to your chest to help sound waves pass through your skin. ?A handheld device, called a transducer, will be pressed against your chest and moved over your heart. The transducer produces sound waves that travel to your heart and bounce back (or "echo" back) to the transducer. These sound waves will be captured in real-time and changed into images of your heart that can be viewed on a video monitor. The images will be recorded on a computer and reviewed by your health care provider. ?You may be asked to change positions or hold your breath for a short time. This makes it easier to get different views or better views of your  heart. ?In some cases, you may receive contrast through an IV in one of your veins. This can improve the quality of the pictures from your heart. ?The procedure may vary among health care providers and hospitals.   ?What can I expect after the test? ?You may return to your normal, everyday life, including diet, activities, and medicines, unless your health care provider tells you not to do that. ?Follow these instructions at home: ?It is up to you to get the results of your test. Ask your health care provider, or the department that is doing the test, when your results will be ready. ?Keep all follow-up visits. This is important. ?Summary ?An echocardiogram is a test that uses sound waves (ultrasound) to produce images of the heart. ?Images from an echocardiogram can provide important information about the size and shape of your heart, heart muscle function, heart valve function, and other possible heart problems. ?You do not need to do anything to prepare before this test. You may eat and drink normally. ?After the echocardiogram is completed, you may return to your normal, everyday life, unless your health care provider tells you not to do that. ?This information is not intended to replace advice given to you by your health care provider. Make  sure you discuss any questions you have with your health care provider. ?Document Revised: 01/15/2020 Document Reviewed: 01/15/2020 ?Elsevier Patient Education ? [place]. ? ? ?

## 2021-08-06 DIAGNOSIS — R413 Other amnesia: Secondary | ICD-10-CM | POA: Diagnosis not present

## 2021-08-11 DIAGNOSIS — Z20822 Contact with and (suspected) exposure to covid-19: Secondary | ICD-10-CM | POA: Diagnosis not present

## 2021-08-13 ENCOUNTER — Ambulatory Visit (INDEPENDENT_AMBULATORY_CARE_PROVIDER_SITE_OTHER): Payer: Medicare Other

## 2021-08-13 ENCOUNTER — Other Ambulatory Visit: Payer: Self-pay

## 2021-08-13 DIAGNOSIS — R011 Cardiac murmur, unspecified: Secondary | ICD-10-CM

## 2021-08-13 LAB — ECHOCARDIOGRAM COMPLETE
Area-P 1/2: 4.21 cm2
S' Lateral: 2.9 cm

## 2021-08-18 DIAGNOSIS — Z20828 Contact with and (suspected) exposure to other viral communicable diseases: Secondary | ICD-10-CM | POA: Diagnosis not present

## 2021-08-20 DIAGNOSIS — F0280 Dementia in other diseases classified elsewhere without behavioral disturbance: Secondary | ICD-10-CM | POA: Diagnosis not present

## 2021-08-20 DIAGNOSIS — R634 Abnormal weight loss: Secondary | ICD-10-CM | POA: Diagnosis not present

## 2021-08-20 DIAGNOSIS — G301 Alzheimer's disease with late onset: Secondary | ICD-10-CM | POA: Diagnosis not present

## 2021-08-20 DIAGNOSIS — T17928A Food in respiratory tract, part unspecified causing other injury, initial encounter: Secondary | ICD-10-CM | POA: Diagnosis not present

## 2021-08-20 DIAGNOSIS — M3320 Polymyositis, organ involvement unspecified: Secondary | ICD-10-CM | POA: Diagnosis not present

## 2021-08-26 DIAGNOSIS — R002 Palpitations: Secondary | ICD-10-CM | POA: Diagnosis not present

## 2021-09-05 DIAGNOSIS — Z20822 Contact with and (suspected) exposure to covid-19: Secondary | ICD-10-CM | POA: Diagnosis not present

## 2021-09-08 DIAGNOSIS — Z20822 Contact with and (suspected) exposure to covid-19: Secondary | ICD-10-CM | POA: Diagnosis not present

## 2021-09-11 DIAGNOSIS — Z20822 Contact with and (suspected) exposure to covid-19: Secondary | ICD-10-CM | POA: Diagnosis not present

## 2021-09-14 DIAGNOSIS — Z20822 Contact with and (suspected) exposure to covid-19: Secondary | ICD-10-CM | POA: Diagnosis not present

## 2021-09-17 DIAGNOSIS — G301 Alzheimer's disease with late onset: Secondary | ICD-10-CM | POA: Diagnosis not present

## 2021-09-17 DIAGNOSIS — J329 Chronic sinusitis, unspecified: Secondary | ICD-10-CM | POA: Diagnosis not present

## 2021-09-17 DIAGNOSIS — F0280 Dementia in other diseases classified elsewhere without behavioral disturbance: Secondary | ICD-10-CM | POA: Diagnosis not present

## 2021-10-08 DIAGNOSIS — M3322 Polymyositis with myopathy: Secondary | ICD-10-CM | POA: Diagnosis not present

## 2021-10-14 DIAGNOSIS — Z20822 Contact with and (suspected) exposure to covid-19: Secondary | ICD-10-CM | POA: Diagnosis not present

## 2021-10-15 DIAGNOSIS — Z20822 Contact with and (suspected) exposure to covid-19: Secondary | ICD-10-CM | POA: Diagnosis not present

## 2021-10-22 DIAGNOSIS — G301 Alzheimer's disease with late onset: Secondary | ICD-10-CM | POA: Diagnosis not present

## 2021-10-22 DIAGNOSIS — F0280 Dementia in other diseases classified elsewhere without behavioral disturbance: Secondary | ICD-10-CM | POA: Diagnosis not present

## 2021-11-13 ENCOUNTER — Telehealth: Payer: Self-pay

## 2021-11-13 NOTE — Telephone Encounter (Signed)
Appt scheduled with Dr. Lyndel Safe 11/25/21.  Requested records from other facility to be faxed asap.

## 2021-11-13 NOTE — Telephone Encounter (Signed)
LVM for patient on daughter's number to call get an appointment with Dr Lyndel Safe in the next 1-2 weeks.

## 2021-11-19 NOTE — Telephone Encounter (Signed)
Received

## 2021-11-21 DIAGNOSIS — N281 Cyst of kidney, acquired: Secondary | ICD-10-CM | POA: Diagnosis not present

## 2021-11-21 DIAGNOSIS — J189 Pneumonia, unspecified organism: Secondary | ICD-10-CM | POA: Diagnosis not present

## 2021-11-21 DIAGNOSIS — K8020 Calculus of gallbladder without cholecystitis without obstruction: Secondary | ICD-10-CM | POA: Diagnosis not present

## 2021-11-21 DIAGNOSIS — N289 Disorder of kidney and ureter, unspecified: Secondary | ICD-10-CM | POA: Diagnosis not present

## 2021-11-21 DIAGNOSIS — J181 Lobar pneumonia, unspecified organism: Secondary | ICD-10-CM | POA: Diagnosis not present

## 2021-11-21 DIAGNOSIS — K220 Achalasia of cardia: Secondary | ICD-10-CM | POA: Diagnosis not present

## 2021-11-21 DIAGNOSIS — R634 Abnormal weight loss: Secondary | ICD-10-CM | POA: Diagnosis not present

## 2021-11-21 DIAGNOSIS — R531 Weakness: Secondary | ICD-10-CM | POA: Diagnosis not present

## 2021-11-21 DIAGNOSIS — N3000 Acute cystitis without hematuria: Secondary | ICD-10-CM | POA: Diagnosis not present

## 2021-11-25 ENCOUNTER — Encounter: Payer: Self-pay | Admitting: Gastroenterology

## 2021-11-25 ENCOUNTER — Ambulatory Visit (INDEPENDENT_AMBULATORY_CARE_PROVIDER_SITE_OTHER): Payer: Medicare Other | Admitting: Gastroenterology

## 2021-11-25 ENCOUNTER — Other Ambulatory Visit: Payer: Self-pay | Admitting: *Deleted

## 2021-11-25 VITALS — BP 116/64 | HR 90 | Ht 64.0 in | Wt 86.0 lb

## 2021-11-25 DIAGNOSIS — R627 Adult failure to thrive: Secondary | ICD-10-CM | POA: Diagnosis not present

## 2021-11-25 DIAGNOSIS — R634 Abnormal weight loss: Secondary | ICD-10-CM

## 2021-11-25 MED ORDER — OMEPRAZOLE 20 MG PO CPDR: 20.0000 mg | DELAYED_RELEASE_CAPSULE | Freq: Every day | ORAL | 3 refills | Status: AC

## 2021-11-25 NOTE — Progress Notes (Signed)
Chief Complaint: Failure to thrive  Referring Provider:  Nicoletta Dress, MD      ASSESSMENT AND PLAN;   #1. Failure to thrive with profound wt loss  #2. H/O aspiration  #3. Oropharyngeal dysphagia d/t polymyositis.  Plan: -Omeprazole '20mg'$  po QD #90 -IR consult for PEG.  -Nutrition consult for peg feeds. -aspiration precautions. -D/W family in detail.   Discussed in detail with risks and benefits of PEG. HPI:    [name] is a 76 y.o. female  With polymyositis on MTX (followed by Dr. Amil Amen), HTN, OA, hypothyroidism after thyroidectomy.  With failure to thrive Profound weight loss (2016 192lb, 02/2019 103lb, today 86lb) With extensive neg GI WU by Dr. Lyda Jester as below.  Speech therapy consult 01/08/2020 showing moderate oropharyngeal dysphagia  Has also been on Remeron PEG has been recommended  Intermittent dysphagia with Ba swallow Oct 2022 showing mild penetration into the larynx, esophageal dysmotility.  13 mm tablet passed into the stomach without problems.  No stricture or HH.  Has fairly good appetite but has not been able to maintain weight. No problems with chewing.   Most recently seen in ED on 11/21/2021 at [facility] d/t dehydration, generalized weakness. Underwent CT Abdo/pelvis with contrast showing left lower lobe patchy groundglass airspace opacities, cholelithiasis, 2.2 cm left renal lesion.  No small bowel obstruction.  Treated with Augmentin.  Had problems with constipation, better now.   No Abdo pain. No N/V/fever/chills/cough/melena or hematochezia.    Past GI work-up EGD 10/11/2019: Superficial antral ulcer, HH, Eso dil 50 Fr. NSAIDS stopped Colonoscopy 09/04/2011: Hyperplastic polyps Colonoscopy 08/20/2003: Small internal hemorrhoids CT AP with contrast 11/21/2021: Left lower lobe patchy groundglass airspace opacities, cholelithiasis, 2.2 cm left renal lesion.  No small bowel obstruction. Past Medical History:  Diagnosis Date   Anal  fissure    Arthritis    Atrophic vaginitis    BPPV (benign paroxysmal positional vertigo)    Chronic sinusitis    DDD (degenerative disc disease), lumbar    Diarrhea    Esophageal dysphagia    Heart block    Hemorrhoids    Hiatal hernia with GERD    Hyperlipidemia LDL goal <100    Hypertension, essential, benign    Memory loss    Mobitz II    Osteoarthritis    Overactive bladder    Polymyositis (HCC)    Postsurgical hypothyroidism    Raynaud's phenomenon    Sensorineural hearing loss (SNHL), bilateral    Tremor    Urinary incontinence    Vertigo     Past Surgical History:  Procedure Laterality Date   BILATERAL OOPHORECTOMY     CARPAL TUNNEL RELEASE     CATARACT EXTRACTION, BILATERAL     COLONOSCOPY  01/04/2012   Dr Lyda Jester. Diminutive rectal polyp (1). No source of signicifant GI blood loss identified   ESOPHAGOGASTRODUODENOSCOPY  10/11/2019   Dr Lyda Jester. Diminutive antral ulcers (2) Hiatal hernia. Empiric esophageal dilation to 50 [place]   HERNIA REPAIR  06/2021   THYROIDECTOMY     TOTAL KNEE ARTHROPLASTY Right    TUBAL LIGATION      Family History  Problem Relation Age of Onset   Diabetes Mother    Heart disease Mother    Colon cancer Neg Hx    Stomach cancer Neg Hx    Esophageal cancer Neg Hx    Colon polyps Neg Hx     Social History   Tobacco Use   Smoking status: Never  Smokeless tobacco: Never  Vaping Use   Vaping Use: Never used  Substance Use Topics   Alcohol use: Never   Drug use: Never    Current Outpatient Medications  Medication Sig Dispense Refill   amLODipine (NORVASC) 5 MG tablet Take 5 mg by mouth daily.     Biotin 10000 MCG TABS Take 10,000 mcg by mouth daily.     Calcium Carbonate-Vit D-Min (CALCIUM 600+D PLUS MINERALS PO) Take 1 tablet by mouth daily.     cholecalciferol (VITAMIN D) 25 MCG (1000 UNIT) tablet Take 1,000 Units by mouth daily.     donepezil (ARICEPT) 5 MG tablet Take 5 mg by mouth at bedtime.     folic  acid (FOLVITE) 1 MG tablet Take 1 mg by mouth daily.     levothyroxine (SYNTHROID) 150 MCG tablet Take 150 mcg by mouth daily before breakfast.     methotrexate 2.5 MG tablet Take 10 mg by mouth once a week. Caution:Chemotherapy. Protect from light.      mirtazapine (REMERON) 7.5 MG tablet Take 7.5 mg by mouth at bedtime.     Multiple Vitamin (MULTIVITAMINS PO) Take 1 tablet by mouth daily.     No current facility-administered medications for this visit.    No Known Allergies  Review of Systems:  Constitutional: Denies fever, chills, diaphoresis, decreased appetite change and has fatigue.  HEENT: Denies photophobia, eye pain, redness, hearing loss, ear pain, congestion, sore throat, rhinorrhea, sneezing, mouth sores, neck pain, neck stiffness and tinnitus.   Respiratory: Denies SOB, DOE, cough, chest tightness,  and wheezing.   Cardiovascular: Denies chest pain, palpitations and leg swelling.  Genitourinary: Denies dysuria, urgency, frequency, hematuria, flank pain and difficulty urinating.  Musculoskeletal: Denies myalgias, back pain, joint swelling, arthralgias and gait problem.  Skin: No rash.  Neurological: Denies dizziness, seizures, syncope, weakness, light-headedness, numbness and headaches.  Hematological: Denies adenopathy. Easy bruising, personal or family bleeding history  Psychiatric/Behavioral: No anxiety or depression     Physical Exam:    Ht '5\' 4"'$  (1.626 m)   Wt 86 lb (39 kg)   BMI 14.76 kg/m  Wt Readings from Last 3 Encounters:  11/25/21 86 lb (39 kg)  08/05/21 91 lb 9.6 oz (41.5 kg)   Constitutional: Very thin built.  In no acute distress. Psychiatric: Normal mood and affect. Behavior is normal. HEENT: Pupils normal.  Conjunctivae are normal. No scleral icterus. Cardiovascular: Normal rate, regular rhythm. No edema Pulmonary/chest: Effort normal and breath sounds normal. No wheezing, rales or rhonchi. Abdominal: Soft, nondistended. Nontender. Bowel sounds  active throughout. There are no masses palpable. No hepatomegaly. Rectal: Deferred Neurological: Alert and oriented to person place and time. Skin: Skin is warm and dry. No rashes noted.  Data Reviewed: I have personally reviewed following labs and imaging studies  Labs from [facility] 11/21/2021 -CBC: WBC count 8.4, hemoglobin 13.5, platelets 146 -CMP sodium 139, potassium 3.8, BUN 20, creatinine 0.4, glucose 134 -TSH 8.66, free T4: 2.07, free T3: 3.52 -PT 10.0 INR 1.0  CT Abdo/pelvis with contrast 11/21/2021 reviewed. Extensive detailed notes were reviewed from Dr. Lyda Jester.  Scanned under media tab.    Carmell Austria, MD 11/25/2021, 9:16 AM  Cc: Nicoletta Dress, MD

## 2021-11-25 NOTE — Patient Instructions (Addendum)
If you are age 76 or older, your body mass index should be between 23-30. Your Body mass index is 14.76 kg/m. If this is out of the aforementioned range listed, please consider follow up with your Primary Care Provider.  If you are age 22 or younger, your body mass index should be between 19-25. Your Body mass index is 14.76 kg/m. If this is out of the aformentioned range listed, please consider follow up with your Primary Care Provider.   ________________________________________________________  The [facility] providers would like to encourage you to use [facility] to communicate with providers for non-urgent requests or questions.  Due to long hold times on the telephone, sending your provider a message by [facility] may be a faster and more efficient way to get a response.  Please allow 48 business hours for a response.  Please remember that this is for non-urgent requests.  _______________________________________________________  We have sent the following medications to your pharmacy for you to pick up at your convenience: Omeprazole  Please call back in 1 week regarding IR consult.  A order has been placed for IR consult for PEG Tube. They will be calling. They don't have anything for 2 weeks out at least at [facility]  Thank you,  Dr. Jackquline Denmark

## 2021-11-27 DIAGNOSIS — Z139 Encounter for screening, unspecified: Secondary | ICD-10-CM | POA: Diagnosis not present

## 2021-11-27 DIAGNOSIS — Z0000 Encounter for general adult medical examination without abnormal findings: Secondary | ICD-10-CM | POA: Diagnosis not present

## 2021-11-27 DIAGNOSIS — Z9181 History of falling: Secondary | ICD-10-CM | POA: Diagnosis not present

## 2021-11-27 DIAGNOSIS — Z1331 Encounter for screening for depression: Secondary | ICD-10-CM | POA: Diagnosis not present

## 2021-12-02 ENCOUNTER — Telehealth: Payer: Self-pay | Admitting: Gastroenterology

## 2021-12-02 ENCOUNTER — Other Ambulatory Visit: Payer: Self-pay | Admitting: Radiology

## 2021-12-02 DIAGNOSIS — E46 Unspecified protein-calorie malnutrition: Secondary | ICD-10-CM

## 2021-12-02 NOTE — Telephone Encounter (Signed)
Dr. Lyndel Safe,  we will have to refer the patient to home health for them to administer feedings. We need an order for the feedings and what specific supplement you want the patient to have. Please advise, thank you!

## 2021-12-02 NOTE — Telephone Encounter (Signed)
Patient's daughter, Levada Dy (on [place]), called.  Patient is getting a PEG tube placement tomorrow.  She needs to know what to do post-placement as far as feeding, upkeep, supplies, etc.  Please call Levada Dy at [phone] and advise.  Thank you.

## 2021-12-03 ENCOUNTER — Other Ambulatory Visit: Payer: Self-pay

## 2021-12-03 ENCOUNTER — Ambulatory Visit (HOSPITAL_COMMUNITY)
Admission: RE | Admit: 2021-12-03 | Discharge: 2021-12-03 | Disposition: A | Discharge status: Home / Self Care | Payer: Medicare Other | Source: Ambulatory Visit | Attending: Gastroenterology | Admitting: Gastroenterology

## 2021-12-03 ENCOUNTER — Encounter (HOSPITAL_COMMUNITY): Payer: Self-pay

## 2021-12-03 DIAGNOSIS — R1310 Dysphagia, unspecified: Secondary | ICD-10-CM | POA: Insufficient documentation

## 2021-12-03 DIAGNOSIS — R634 Abnormal weight loss: Secondary | ICD-10-CM | POA: Diagnosis not present

## 2021-12-03 DIAGNOSIS — E785 Hyperlipidemia, unspecified: Secondary | ICD-10-CM | POA: Diagnosis not present

## 2021-12-03 DIAGNOSIS — E639 Nutritional deficiency, unspecified: Secondary | ICD-10-CM | POA: Diagnosis not present

## 2021-12-03 DIAGNOSIS — I10 Essential (primary) hypertension: Secondary | ICD-10-CM | POA: Diagnosis not present

## 2021-12-03 DIAGNOSIS — E46 Unspecified protein-calorie malnutrition: Secondary | ICD-10-CM

## 2021-12-03 DIAGNOSIS — R627 Adult failure to thrive: Secondary | ICD-10-CM | POA: Diagnosis not present

## 2021-12-03 DIAGNOSIS — K219 Gastro-esophageal reflux disease without esophagitis: Secondary | ICD-10-CM | POA: Diagnosis not present

## 2021-12-03 HISTORY — PX: IR GASTROSTOMY TUBE MOD SED: IMG625

## 2021-12-03 LAB — BASIC METABOLIC PANEL
Anion gap: 10 (ref 5–15)
BUN: 16 mg/dL (ref 8–23)
CO2: 29 mmol/L (ref 22–32)
Calcium: 9.3 mg/dL (ref 8.9–10.3)
Chloride: 102 mmol/L (ref 98–111)
Creatinine, Ser: 0.54 mg/dL (ref 0.44–1.00)
GFR, Estimated: >60 mL/min (ref >60)
Glucose, Bld: 102 mg/dL — ABNORMAL HIGH (ref 70–99)
Potassium: 3.7 mmol/L (ref 3.5–5.1)
Sodium: 141 mmol/L (ref 135–145)

## 2021-12-03 LAB — CBC WITH DIFFERENTIAL/PLATELET
Abs Immature Granulocytes: 0.02 10*3/uL (ref 0.00–0.07)
Basophils Absolute: 0 10*3/uL (ref 0.0–0.1)
Basophils Relative: 1 %
Eosinophils Absolute: 0.2 10*3/uL (ref 0.0–0.5)
Eosinophils Relative: 3 %
HCT: 44.3 % (ref 36.0–46.0)
Hemoglobin: 14.3 g/dL (ref 12.0–15.0)
Immature Granulocytes: 0 %
Lymphocytes Relative: 13 %
Lymphs Abs: 0.9 10*3/uL (ref 0.7–4.0)
MCH: 31.1 pg (ref 26.0–34.0)
MCHC: 32.3 g/dL (ref 30.0–36.0)
MCV: 96.3 fL (ref 80.0–100.0)
Monocytes Absolute: 0.4 10*3/uL (ref 0.1–1.0)
Monocytes Relative: 5 %
Neutro Abs: 5 10*3/uL (ref 1.7–7.7)
Neutrophils Relative %: 78 %
Platelets: 167 10*3/uL (ref 150–400)
RBC: 4.6 MIL/uL (ref 3.87–5.11)
RDW: 14.4 % (ref 11.5–15.5)
WBC: 6.5 10*3/uL (ref 4.0–10.5)
nRBC: 0 % (ref 0.0–0.2)

## 2021-12-03 LAB — PROTIME-INR
INR: 0.9 (ref 0.8–1.2)
Prothrombin Time: 12.4 seconds (ref 11.4–15.2)

## 2021-12-03 MED ORDER — SODIUM CHLORIDE 0.9 % IV SOLN: INTRAVENOUS | Status: DC

## 2021-12-03 MED ORDER — MIDAZOLAM HCL 2 MG/2ML IJ SOLN
INTRAMUSCULAR | Status: AC | PRN
  Administered 2021-12-03: .5 mg via INTRAVENOUS

## 2021-12-03 MED ORDER — MIDAZOLAM HCL 2 MG/2ML IJ SOLN
INTRAMUSCULAR | Status: AC
  Filled 2021-12-03: qty 2

## 2021-12-03 MED ORDER — FENTANYL CITRATE (PF) 100 MCG/2ML IJ SOLN
INTRAMUSCULAR | Status: AC
  Filled 2021-12-03: qty 2

## 2021-12-03 MED ORDER — LIDOCAINE-EPINEPHRINE 1 %-1:100000 IJ SOLN
INTRAMUSCULAR | Status: AC
  Filled 2021-12-03: qty 1

## 2021-12-03 MED ORDER — GLUCAGON HCL RDNA (DIAGNOSTIC) 1 MG IJ SOLR
INTRAMUSCULAR | Status: AC
  Filled 2021-12-03: qty 1

## 2021-12-03 MED ORDER — LIDOCAINE VISCOUS HCL 2 % MT SOLN
OROMUCOSAL | Status: AC
  Filled 2021-12-03: qty 15

## 2021-12-03 MED ORDER — CEFAZOLIN SODIUM-DEXTROSE 2-4 GM/100ML-% IV SOLN
INTRAVENOUS | Status: AC
  Administered 2021-12-03: 2 g via INTRAVENOUS
  Filled 2021-12-03: qty 100

## 2021-12-03 MED ORDER — CEFAZOLIN SODIUM-DEXTROSE 2-4 GM/100ML-% IV SOLN: 2.0000 g | INTRAVENOUS | Status: AC

## 2021-12-03 MED ORDER — IOHEXOL 300 MG/ML  SOLN
50.0000 mL | Freq: Once | INTRAMUSCULAR | Status: AC | PRN
Start: 2021-12-03 — End: 2021-12-03
  Administered 2021-12-03: 10 mL

## 2021-12-03 MED ORDER — FENTANYL CITRATE (PF) 100 MCG/2ML IJ SOLN
INTRAMUSCULAR | Status: AC | PRN
  Administered 2021-12-03: 50 ug via INTRAVENOUS

## 2021-12-03 NOTE — Consult Note (Signed)
Chief Complaint: Patient was seen in consultation today for percutaneous gastrostomy tube placement  Referring Physician(s): Gupta,Rajesh  Supervising Physician: Ruthann Cancer  Patient Status:   History of Present Illness: [name] is a 76 y.o. female with past medical history significant for arthritis, degenerative disc disease, hiatal hernia, GERD, hyperlipidemia, hypertension, history of arthritis, denies phenomenon, vertigo who presents now with failure to thrive, profound weight loss, history of aspiration, dysphagia due to polymyositis.  Recent CT abdomen pelvis at [facility] showed left lower lobe patchy groundglass airspace opacities, cholelithiasis and 2.2 cm left renal lesion.  There was no small bowel obstruction.  She is scheduled today for percutaneous gastrostomy tube placement to assist with nutritional needs.  Past Medical History:  Diagnosis Date   Anal fissure    Arthritis    Atrophic vaginitis    BPPV (benign paroxysmal positional vertigo)    Chronic sinusitis    DDD (degenerative disc disease), lumbar    Diarrhea    Esophageal dysphagia    Heart block    Hemorrhoids    Hiatal hernia with GERD    Hyperlipidemia LDL goal <100    Hypertension, essential, benign    Memory loss    Mobitz II    Osteoarthritis    Overactive bladder    Polymyositis (HCC)    Postsurgical hypothyroidism    Raynaud's phenomenon    Sensorineural hearing loss (SNHL), bilateral    Tremor    Urinary incontinence    Vertigo     Past Surgical History:  Procedure Laterality Date   BILATERAL OOPHORECTOMY     CARPAL TUNNEL RELEASE     CATARACT EXTRACTION, BILATERAL     COLONOSCOPY  01/04/2012   Dr Lyda Jester. Diminutive rectal polyp (1). No source of signicifant GI blood loss identified   ESOPHAGOGASTRODUODENOSCOPY  10/11/2019   Dr Lyda Jester. Diminutive antral ulcers (2) Hiatal hernia. Empiric esophageal dilation to 50 [place]   HERNIA REPAIR  06/2021    THYROIDECTOMY     TOTAL KNEE ARTHROPLASTY Right    TUBAL LIGATION      Allergies: Patient has no known allergies.  Medications: Prior to Admission medications   Medication Sig Start Date End Date Taking? Authorizing Provider  amLODipine (NORVASC) 5 MG tablet Take 5 mg by mouth daily. 12/27/19   [provider]  Biotin 10000 MCG TABS Take 10,000 mcg by mouth daily.    [provider]  Calcium Carbonate-Vit D-Min (CALCIUM 600+D PLUS MINERALS PO) Take 1 tablet by mouth daily.    [provider]  cholecalciferol (VITAMIN D) 25 MCG (1000 UNIT) tablet Take 1,000 Units by mouth daily.    [provider]  donepezil (ARICEPT) 5 MG tablet Take 5 mg by mouth at bedtime. 07/30/21   [provider]  folic acid (FOLVITE) 1 MG tablet Take 1 mg by mouth daily. 07/09/21   [provider]  levothyroxine (SYNTHROID) 150 MCG tablet Take 150 mcg by mouth daily before breakfast.    [provider]  methotrexate 2.5 MG tablet Take 10 mg by mouth once a week. Caution:Chemotherapy. Protect from light.     [provider]  mirtazapine (REMERON) 7.5 MG tablet Take 7.5 mg by mouth at bedtime. 08/02/21   [provider]  Multiple Vitamin (MULTIVITAMINS PO) Take 1 tablet by mouth daily.    [provider]  omeprazole (PRILOSEC) 20 MG capsule Take 1 capsule (20 mg total) by mouth daily. 11/25/21   Jackquline Denmark, MD  Family History  Problem Relation Age of Onset   Diabetes Mother    Heart disease Mother    Colon cancer Neg Hx    Stomach cancer Neg Hx    Esophageal cancer Neg Hx    Colon polyps Neg Hx     Social History   Socioeconomic History   Marital status: Married    Spouse name: Not on file   Number of children: 3   Years of education: Not on file   Highest education level: Not on file  Occupational History   Occupation: Retired  Tobacco Use   Smoking status: Never   Smokeless tobacco: Never  Vaping Use    Vaping Use: Never used  Substance and Sexual Activity   Alcohol use: Never   Drug use: Never   Sexual activity: Not on file  Other Topics Concern   Not on file  Social History Narrative   Not on file   Social Determinants of Health   Financial Resource Strain: Not on file  Food Insecurity: Not on file  Transportation Needs: Not on file  Physical Activity: Not on file  Stress: Not on file  Social Connections: Not on file      Review of Systems currently denies fever, headache, chest pain, dyspnea, cough, abdominal/back pain, nausea, vomiting or bleeding.  Vital Signs:pend      Physical Exam awake,  answers simple questions okay.  Cachectic appearing.  Daughter in room.  Chest clear to auscultation bilaterally.  Heart with normal rate, some occasional ectopy.  Abdomen soft, positive bowel sounds, nontender.  No lower extremity edema.  Imaging: No results found.  Labs:  CBC: No results for input(s): "WBC", "HGB", "HCT", "PLT" in the last 8760 hours.  COAGS: No results for input(s): "INR", "APTT" in the last 8760 hours.  BMP: No results for input(s): "NA", "K", "CL", "CO2", "GLUCOSE", "BUN", "CALCIUM", "CREATININE", "GFRNONAA", "GFRAA" in the last 8760 hours.  Invalid input(s): "CMP"  LIVER FUNCTION TESTS: No results for input(s): "BILITOT", "AST", "ALT", "ALKPHOS", "PROT", "ALBUMIN" in the last 8760 hours.  TUMOR MARKERS: No results for input(s): "AFPTM", "CEA", "CA199", "CHROMGRNA" in the last 8760 hours.  Assessment and Plan: 76 y.o. female with past medical history significant for arthritis, degenerative disc disease, hiatal hernia, GERD, hyperlipidemia, hypertension, history of arthritis, denies phenomenon, vertigo who presents now with failure to thrive, profound weight loss, history of aspiration, dysphagia due to polymyositis.  Recent CT abdomen pelvis at [facility] showed left lower lobe patchy groundglass airspace opacities, cholelithiasis and 2.2  cm left renal lesion.  There was no small bowel obstruction.  She is scheduled today for percutaneous gastrostomy tube placement to assist with nutritional needs.Risks and benefits image guided gastrostomy tube placement was discussed with the patient /daughter including, but not limited to the need for a barium enema during the procedure, bleeding, infection, peritonitis and/or damage to adjacent structures.  All of the patient's questions were answered, patient is agreeable to proceed.  Consent signed and in chart.  LABS PENDING  Thank you for this interesting consult.  I greatly enjoyed meeting [name] and look forward to participating in their care.  A copy of this report was sent to the requesting provider on this date.  Electronically Signed: D. Rowe Robert, PA-C 12/03/2021, 7:48 AM   I spent a total of  25 minutes   in face to face in clinical consultation, greater than 50% of which was counseling/coordinating care for percutaneous gastrostomy tube placement

## 2021-12-03 NOTE — Procedures (Signed)
Interventional Radiology Procedure Note ? ?Procedure: Placement of percutaneous 20F balloon retention gastrostomy tube. ? ?Complications: None ? ?Recommendations: ?- NPO except for sips and chips remainder of today and overnight ?- May advance diet as tolerated and begin using tube tomorrow morning ? ? ?Logan Baltimore, MD ?Pager: [phone] ? ? ? ?

## 2021-12-03 NOTE — Discharge Instructions (Addendum)
For questions /concerns may call Interventional Radiology at [phone]    Moderate Conscious Sedation, Adult, Care After This sheet gives you information about how to care for yourself after your procedure. Your health care provider may also give you more specific instructions. If you have problems or questions, contact your health careprovider. What can I expect after the procedure? After the procedure, it is common to have: Sleepiness for several hours. Impaired judgment for several hours. Difficulty with balance.  Follow these instructions at home: For the time period you were told by your health care provider: Rest. Do not participate in activities where you could fall or become injured. Do not drive or use machinery. Do not drink alcohol. Do not take sleeping pills or medicines that cause drowsiness. Do not make important decisions or sign legal documents. Do not take care of children on your own. Eating and drinking  Follow the diet recommended by your health care provider. Drink enough fluid to keep your urine pale yellow. If you vomit: Drink water, juice, or soup when you can drink without vomiting. Make sure you have little or no nausea before eating solid foods.  General instructions Take over-the-counter and prescription medicines only as told by your health care provider. Have a responsible adult stay with you for the time you are told. It is important to have someone help care for you until you are awake and alert. Do not smoke. Keep all follow-up visits as told by your health care provider. This is important. Contact a health care provider if: You are still sleepy or having trouble with balance after 24 hours. You feel light-headed. You keep feeling nauseous or you keep vomiting. You develop a rash. You have a fever. You have redness or swelling around the IV site. Get help right away if: You have trouble breathing. You have new-onset confusion at  home. Summary After the procedure, it is common to feel sleepy, have impaired judgment, or feel nauseous if you eat too soon. Rest after you get home. Know the things you should not do after the procedure. Follow the diet recommended by your health care provider and drink enough fluid to keep your urine pale yellow. Get help right away if you have trouble breathing or new-onset confusion at home. This information is not intended to replace advice given to you by your health care provider. Make sure you discuss any questions you have with your healthcare provider. Document Revised: 09/21/2019 Document Reviewed: 04/19/2019 Elsevier Patient Education  2022 [place].  Gastrostomy Tube Home Guide, Adult A gastrostomy tube, or G-tube, is a tube that is inserted through the abdomen into the stomach. The tube is used to give feedings and medicines when a personcannot eat and drink enough on his or her own or take medicines by mouth. Recommendations: - nothing to eat, except for sips and chips of ice remainder of today and overnight - May advance diet as tolerated and begin using tube tomorrow morning (Friday 12/04/21)  How to care for the insertion site: Supplies needed: Saline solution or clean, warm water and soap. Saline solution is made of salt and water. Cotton swab or gauze. Pre-cut gauze bandage (dressing) and tape, if needed. Call Lanna Poche to discuss supplies/nutrition needs Instructions Follow these steps daily to clean the insertion site: Wash your hands with soap and water for at least 20 seconds. Remove the dressing (if there is one) that is between the person's skin and the tube. Check the area where the tube  enters the skin. Check daily for problems such as: Redness, rash, or irritation. Swelling. Pus-like drainage. Extra skin growth. Cleanse site with soap and water, dress with triple-antibiotic ointment, and cover with gauze for 7 days. Do not use scissors near the  catheter Moisten the cotton swab or gauze with the saline solution or with a soap-and-water mixture. Gently clean around the insertion site. Remove any drainage or crusted material. When the G-tube is first put in, a normal saline solution or water can be used to clean the skin. After the skin around the tube has healed, mild soap and water may be used. Apply a dressing (if there should be one) between the person's skin and the tube. How to flush a G-tube Flush the G-tube regularly to keep it from clogging. Flush it before and afterfeedings and as often as told by the health care provider. Supplies needed: Purified or germ-free (sterile) water, warmed. Container with lid for boiling water, if needed. 60 cc G-tube syringe. Instructions Before you begin, decide whether to use sterile water or purified drinking water. Use only sterile water if: The person has a weak disease-fighting (immune) system. The person has trouble fighting off infections (is immunocompromised). You are unsure about the amount of chemical contaminants in purified or drinking water. Use purified drinking water in all other cases. To purify drinking water by boiling: Boil water for at least 1 minute. Keep lid over water while it boils. Let water cool to room temperature before using. Follow these steps to flush the G-tube: Wash your hands with soap and water for at least 20 seconds. Bring out (draw up) 30 mL of warm water in a syringe. Connect the syringe to the tube. Slowly and gently push the water into the tube. General tips If the tube comes out: Cover the opening with a clean dressing and tape. Get help right away. If there is skin or scar tissue growing where the tube enters the skin: Keep the area clean and dry. Secure the tube with tape so that the tube does not move around too much. If the tube gets clogged: Slowly push warm water into the tube with a large syringe. Do not force the fluid into the tube or  push an object into the tube. Get help right away if you cannot unclog the tube. Follow these instructions at home: Feedings Give feedings at room temperature. If feedings are continuous: Do not put more than 4 hours' worth of feedings in the feeding bag. Stop the feedings when you need to give medicine or flush the tube. Be sure to restart the feedings. Make sure the person's head is above his or her stomach (upright position). This will prevent choking and discomfort. Make sure the person is in the right position during and after feedings. During feedings, have the person in the upright position. After a non-continuous feeding (bolus feeding), have the person stay in the upright position for 1 hour. Cover and place unused feedings in the refrigerator. Replace feeding bags and syringes as told. Good hygiene Make sure the person takes good care of his or her mouth and teeth (oral hygiene), such as by brushing his or her teeth. Keep the area where the tube enters the skin clean and dry. General instructions Do not pull or put tension on the tube. Before you remove the tube cap or disconnect a syringe, close the tube by using a clamp (clamping) or bending (kinking) the tube. Measure the length of the G-tube every day  from the insertion site to the end of the tube. If the person's G-tube has a balloon, check the fluid in the balloon every week. Check the manufacturer's specifications to find the amount of fluid that should be in the balloon. Remove excess air from the G-tube as told. This is called venting. Do not push feedings, medicines, or flushes fast. Use the feeding tube equipment, such as syringes and connectors, only as told by your health care provider. Contact a health care provider if: The person with the tube has constipation or a fever. A large amount of fluid or mucus-like liquid is leaking from the tube. Skin or scar tissue appears to be growing where the tube enters the  skin. The length of tube from the insertion site to the G-tube gets longer. Get help right away if: The person with the tube has any of these problems: Severe pain, tenderness, or bloating in the abdomen. Nausea or vomiting. Trouble breathing or shortness of breath. Any of these problems happen in the area where the tube enters the skin: Redness, irritation, swelling, or soreness. Pus-like discharge. A bad smell. The tube is clogged and cannot be flushed. The tube comes out. The tube will need to be put back in within 4 hours. Summary A gastrostomy tube, or G-tube, is a tube that is inserted through the abdomen into the stomach. The tube is used to give feedings and medicines when a person cannot eat and drink enough on his or her own or cannot take medicine by mouth. Check and clean the insertion site daily as told by the person's health care provider. Flush the G-tube regularly to keep it from clogging. Flush it before and after feedings and as often as told. Keep the area where the tube enters the skin clean and dry. Flush tube with 30 ml water after medications or at least once per shift - this is equal to at least two flushes per day, about 12 hours apart should you not use the tube for meds.   This information is not intended to replace advice given to you by your health care provider. Make sure you discuss any questions you have with your healthcare provider.

## 2021-12-04 NOTE — Telephone Encounter (Signed)
Inbound call from [name] patients son in law inquiring about patient care instructions . Please give [name] a call back to advise at [phone].  Thank you

## 2021-12-04 NOTE — Telephone Encounter (Deleted)
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

## 2021-12-09 ENCOUNTER — Other Ambulatory Visit: Payer: Self-pay

## 2021-12-09 NOTE — Telephone Encounter (Signed)
Patients daughter Levada Dy called to follow up on feeding orders needed for patient. She requested a call back by today with further directions. Thanks

## 2021-12-09 NOTE — Telephone Encounter (Signed)
Dr Lyndel Safe Pt Please advise as DOD Pt had PEG tube recently placed and needs orders for  Nutritional Consult with [facility] for Initiation of feeds along with pt education.  Please review chart and advise:

## 2021-12-09 NOTE — Telephone Encounter (Signed)
[place] for such an order: Nutritional consult with [facility] for eval and treatment; tube feeds and patient education

## 2021-12-10 ENCOUNTER — Other Ambulatory Visit: Payer: Self-pay

## 2021-12-10 DIAGNOSIS — C799 Secondary malignant neoplasm of unspecified site: Secondary | ICD-10-CM

## 2021-12-10 DIAGNOSIS — R9389 Abnormal findings on diagnostic imaging of other specified body structures: Secondary | ICD-10-CM

## 2021-12-10 NOTE — Telephone Encounter (Signed)
Phone call placed along with email sent to Thersa Salt, Dietitian for [place] (used to be [place]) Kim's mobile number is [phone] or email [email or web address]: Spoke to pt daughter Levada Dy and notified that we are working on getting Nehal the tube feeds that she needs:

## 2021-12-10 NOTE — Telephone Encounter (Signed)
Spoke to  Thersa Salt, Dietitian for [place] (used to be [place]): Maudie Mercury requested that referral be sent along with pt records and an order that stated that RD to assess and implement tube feedings:  Pt's Chart sent along with signed order: Faxed to [phone]: Pt daughter Levada Dy made aware Levada Dy verbalized understanding with all questions answered.

## 2021-12-10 NOTE — Addendum Note (Signed)
Addended by: Gillermina Hu on: 12/10/2021 04:12 PM   Modules accepted: Orders

## 2021-12-14 ENCOUNTER — Telehealth: Payer: Self-pay

## 2021-12-14 NOTE — Telephone Encounter (Signed)
Document received requesting orders to be faxed for feeds for PEG tube for pt. Orders signed by Dr. Lyndel Safe and faxed to [phone]:

## 2022-01-02 ENCOUNTER — Emergency Department (HOSPITAL_COMMUNITY)
Admission: EM | Admit: 2022-01-02 | Discharge: 2022-01-02 | Disposition: A | Discharge status: Home / Self Care | Payer: Medicare Other | Attending: Emergency Medicine | Admitting: Emergency Medicine

## 2022-01-02 ENCOUNTER — Encounter (HOSPITAL_COMMUNITY): Payer: Self-pay

## 2022-01-02 ENCOUNTER — Other Ambulatory Visit: Payer: Self-pay

## 2022-01-02 ENCOUNTER — Emergency Department (HOSPITAL_COMMUNITY): Payer: Medicare Other

## 2022-01-02 DIAGNOSIS — K9423 Gastrostomy malfunction: Secondary | ICD-10-CM | POA: Insufficient documentation

## 2022-01-02 DIAGNOSIS — R1310 Dysphagia, unspecified: Secondary | ICD-10-CM | POA: Insufficient documentation

## 2022-01-02 DIAGNOSIS — R634 Abnormal weight loss: Secondary | ICD-10-CM | POA: Insufficient documentation

## 2022-01-02 DIAGNOSIS — T85528A Displacement of other gastrointestinal prosthetic devices, implants and grafts, initial encounter: Secondary | ICD-10-CM

## 2022-01-02 DIAGNOSIS — Z4659 Encounter for fitting and adjustment of other gastrointestinal appliance and device: Secondary | ICD-10-CM | POA: Diagnosis not present

## 2022-01-02 HISTORY — PX: IR REPLACE G-TUBE SIMPLE WO FLUORO: IMG2323

## 2022-01-02 MED ORDER — IOPAMIDOL (ISOVUE-300) INJECTION 61%
30.0000 mL | Freq: Once | INTRAVENOUS | Status: AC | PRN
Start: 2022-01-02 — End: 2022-01-02
  Administered 2022-01-02: 30 mL

## 2022-01-02 NOTE — Discharge Instructions (Signed)
Your gastrostomy tube has been replaced and is ready for use.

## 2022-01-02 NOTE — ED Provider Notes (Signed)
[facility] DEPT Provider Note   CSN: [id number] Arrival date & time: 01/02/22  7824     History  Chief Complaint  Patient presents with   Feeding Tube Problem    [name] is a 76 y.o. female.  Patient presents to the emergency department today for a dislodged percutaneous gastrostomy tube.  This was placed on 12/03/2021 by Dr. Lyndel Safe.  This was placed for nutritional assistance due to profound weight loss and dysphagia.  Patient states that the tube was working well yesterday, last use around 4pm.  When she went to use it this morning, her tube feed leaked out and tube was found to be dislodged.  She states that the balloon was deflated.  It was likely pulled out during the overnight hours.  No abdominal pain.  She is able to take PO's.       Home Medications Prior to Admission medications   Medication Sig Start Date End Date Taking? Authorizing Provider  amLODipine (NORVASC) 5 MG tablet Take 5 mg by mouth daily. 12/27/19   [provider]  Biotin 10000 MCG TABS Take 10,000 mcg by mouth daily.    [provider]  Calcium Carbonate-Vit D-Min (CALCIUM 600+D PLUS MINERALS PO) Take 1 tablet by mouth daily.    [provider]  cholecalciferol (VITAMIN D) 25 MCG (1000 UNIT) tablet Take 1,000 Units by mouth daily.    [provider]  donepezil (ARICEPT) 5 MG tablet Take 5 mg by mouth at bedtime. 07/30/21   [provider]  folic acid (FOLVITE) 1 MG tablet Take 1 mg by mouth daily. 07/09/21   [provider]  levothyroxine (SYNTHROID) 150 MCG tablet Take 150 mcg by mouth daily before breakfast.    [provider]  methotrexate 2.5 MG tablet Take 10 mg by mouth once a week. Caution:Chemotherapy. Protect from light.     [provider]  mirtazapine (REMERON) 7.5 MG tablet Take 7.5 mg by mouth at bedtime. 08/02/21   [provider]  Multiple Vitamin (MULTIVITAMINS PO) Take 1 tablet  by mouth daily.    [provider]  omeprazole (PRILOSEC) 20 MG capsule Take 1 capsule (20 mg total) by mouth daily. 11/25/21   Jackquline Denmark, MD      Allergies    Patient has no known allergies.    Review of Systems   Review of Systems  Physical Exam Updated Vital Signs BP 134/74 (BP Location: Left Arm)   Pulse 85   Temp 98.7 F (37.1 C) (Oral)   Resp 18   SpO2 99%  Physical Exam Vitals and nursing note reviewed.  Constitutional:      Appearance: She is well-developed.  HENT:     Head: Normocephalic and atraumatic.  Eyes:     Conjunctiva/sclera: Conjunctivae normal.  Pulmonary:     Effort: No respiratory distress.  Abdominal:     Tenderness: There is no abdominal tenderness. There is no guarding or rebound.     Comments: G-tube site appears clean and dry.   Musculoskeletal:     Cervical back: Normal range of motion and neck supple.  Skin:    General: Skin is warm and dry.  Neurological:     Mental Status: She is alert.    ED Results / Procedures / Treatments   Labs (all labs ordered are listed, but only abnormal results are displayed) Labs Reviewed - No data to display  EKG None  Radiology No results found.  Procedures Procedures  Medications Ordered in ED Medications - No data to display  ED Course/ Medical Decision Making/ A&P    Patient seen and examined. History obtained directly from patient and from IR notes in Epic.   Labs/EKG: None ordered.   Imaging: None ordered.   Medications/Fluids: None ordered.   Most recent vital signs reviewed and are as follows: BP 134/74 (BP Location: Left Arm)   Pulse 85   Temp 98.7 F (37.1 C) (Oral)   Resp 18   SpO2 99%   Initial impression: Dislodged g-tube. Discussed with Dr. Langston Masker. Given immaturity of tract, will discuss with oncall IR.   11:47 AM I spoke with Dr. Dwaine Gale of IR.  States okay to go ahead and try to place tube into the tract.  If unable to replace G-tube, okay to place smaller  Foley and they will replace tube this afternoon.  I was able to pass a 16 [place] Foley catheter with some effort.  Patient remains comfortable.  Harriet Pho PA-C with IR aware and will facilitate tube replacement this afternoon.  Patient aware and is in agreement.   2:33 PM patient's gastrostomy tube has been successfully replaced.  I reviewed x-ray confirming placement.  Patient ready for discharge.  Encourage follow-up with her doctors as planned.  Return to the emergency department with any other complications or concerns.                            Medical Decision Making  Patient presents after her gastrostomy tube fell out overnight.  It appears the balloon deflated and became dislodged.  No abdominal pain.  Foley placed in the emergency department and then tube replaced by IR.  Patient ready for discharge.        Final Clinical Impression(s) / ED Diagnoses Final diagnoses:  Dislodged gastrostomy tube    Rx / DC Orders ED Discharge Orders     None         Carlisle Cater, PA-C 01/02/22 1434    Wyvonnia Dusky, MD 01/02/22 1625

## 2022-01-02 NOTE — ED Provider Triage Note (Signed)
Emergency Medicine Provider Triage Evaluation Note  TEDI HUGHSON , a 76 y.o. female  was evaluated in triage.  Pt complains of G-tube falling out. She gets tube feeding yesterday. Went to tube feed this AM and the feed didn't go through the tube and went on her clothes. She reports that tube came out. No trauma. No pain. .  Review of Systems  Positive:  Negative:   Physical Exam  BP 134/74 (BP Location: Left Arm)   Pulse 85   Temp 98.7 F (37.1 C) (Oral)   Resp 18   SpO2 99%  Gen:   Awake, no distress   Resp:  Normal effort  MSK:   Moves extremities without difficulty  Other:  Mild redness to surrounding stoma. Cachetic appearance. Denies any tenderness.   Medical Decision Making  Medically screening exam initiated at 10:36 AM.  Appropriate orders placed.  KYNADI DRAGOS was informed that the remainder of the evaluation will be completed by another provider, this initial triage assessment does not replace that evaluation, and the importance of remaining in the ED until their evaluation is complete.     Sherrell Puller, PA-C 01/02/22 1038

## 2022-01-02 NOTE — Procedures (Signed)
PROCEDURE SUMMARY:  Successful exchange of 16 Fr balloon retention gastrostomy tube. No complications.   EBL = none.  Xray ordered.   Please see full dictation in imaging section of Epic for procedure details.   Armando Gang Bria Sparr PA-C 01/02/2022 1:18 PM

## 2022-01-02 NOTE — ED Triage Notes (Signed)
Pt reports she went to use her feeding tube this morning and the tube came out.

## 2022-01-07 DIAGNOSIS — M1991 Primary osteoarthritis, unspecified site: Secondary | ICD-10-CM | POA: Diagnosis not present

## 2022-01-07 DIAGNOSIS — M5136 Other intervertebral disc degeneration, lumbar region: Secondary | ICD-10-CM | POA: Diagnosis not present

## 2022-01-07 DIAGNOSIS — H04123 Dry eye syndrome of bilateral lacrimal glands: Secondary | ICD-10-CM | POA: Diagnosis not present

## 2022-01-07 DIAGNOSIS — Z79899 Other long term (current) drug therapy: Secondary | ICD-10-CM | POA: Diagnosis not present

## 2022-01-07 DIAGNOSIS — R296 Repeated falls: Secondary | ICD-10-CM | POA: Diagnosis not present

## 2022-01-07 DIAGNOSIS — I7300 Raynaud's syndrome without gangrene: Secondary | ICD-10-CM | POA: Diagnosis not present

## 2022-01-07 DIAGNOSIS — M3322 Polymyositis with myopathy: Secondary | ICD-10-CM | POA: Diagnosis not present

## 2022-01-07 DIAGNOSIS — Z681 Body mass index (BMI) 19 or less, adult: Secondary | ICD-10-CM | POA: Diagnosis not present

## 2022-01-07 DIAGNOSIS — R1310 Dysphagia, unspecified: Secondary | ICD-10-CM | POA: Diagnosis not present

## 2022-01-12 NOTE — Progress Notes (Unsigned)
    01/12/2022 [name] [id number] 09-25-1945  Referring provider: Nicoletta Dress, MD Primary GI doctor: Dr. Lyndel Safe  ASSESSMENT AND PLAN:   There are no diagnoses linked to this encounter.  History of Present Illness:  76 y.o. female  with a past medical history of *** and others listed below, returns to clinic today for evaluation of ***.   Current Medications:   Current Outpatient Medications (Endocrine & Metabolic):    levothyroxine (SYNTHROID) 150 MCG tablet, Take 150 mcg by mouth daily before breakfast.  Current Outpatient Medications (Cardiovascular):    amLODipine (NORVASC) 5 MG tablet, Take 5 mg by mouth daily.    Current Outpatient Medications (Hematological):    folic acid (FOLVITE) 1 MG tablet, Take 1 mg by mouth daily.  Current Outpatient Medications (Other):    Biotin 10000 MCG TABS, Take 10,000 mcg by mouth daily.   Calcium Carbonate-Vit D-Min (CALCIUM 600+D PLUS MINERALS PO), Take 1 tablet by mouth daily.   cholecalciferol (VITAMIN D) 25 MCG (1000 UNIT) tablet, Take 1,000 Units by mouth daily.   donepezil (ARICEPT) 5 MG tablet, Take 5 mg by mouth at bedtime.   methotrexate 2.5 MG tablet, Take 10 mg by mouth once a week. Caution:Chemotherapy. Protect from light.    mirtazapine (REMERON) 7.5 MG tablet, Take 7.5 mg by mouth at bedtime.   Multiple Vitamin (MULTIVITAMINS PO), Take 1 tablet by mouth daily.   omeprazole (PRILOSEC) 20 MG capsule, Take 1 capsule (20 mg total) by mouth daily.  Surgical History:  She  has a past surgical history that includes Tubal ligation; Cataract extraction, bilateral; Thyroidectomy; Carpal tunnel release; Total knee arthroplasty (Right); Bilateral oophorectomy; Esophagogastroduodenoscopy (10/11/2019); Colonoscopy (01/04/2012); Hernia repair (06/2021); IR GASTROSTOMY TUBE MOD SED (12/03/2021); and IR REPLACE G-TUBE SIMPLE WO FLUORO (01/02/2022). Family History:  Her family history includes Diabetes in her mother; Heart disease  in her mother. Social History:   reports that she has never smoked. She has never used smokeless tobacco. She reports that she does not drink alcohol and does not use drugs.  Current Medications, Allergies, Past Medical History, Past Surgical History, Family History and Social History were reviewed in [employer] record.  Physical Exam: There were no vitals taken for this visit. General:   Pleasant, well developed female in no acute distress Heart : Regular rate and rhythm; no murmurs Pulm: Clear anteriorly; no wheezing Abdomen:  {BlankSingle:19197::"Distended","Ridged","Soft"}, {BlankSingle:19197::"Flat","Obese","Non-distended"} AB, {BlankSingle:19197::"Absent","Hyperactive, tinkling","Hypoactive","Sluggish","Active"} bowel sounds. {actendernessAB:27319} tenderness {anatomy; site abdomen:5010}. {BlankMultiple:19196::"Without guarding","With guarding","Without rebound","With rebound"}, No organomegaly appreciated. Rectal: {acrectalexam:27461} Extremities:  {With/without:5700}  edema. Neurologic:  Alert and  oriented x4;  No focal deficits.  Psych:  Cooperative. Normal mood and affect.   Vladimir Crofts, PA-C 01/12/22

## 2022-01-14 ENCOUNTER — Other Ambulatory Visit: Payer: Self-pay

## 2022-01-14 ENCOUNTER — Encounter: Payer: Self-pay | Admitting: Physician Assistant

## 2022-01-14 ENCOUNTER — Other Ambulatory Visit (INDEPENDENT_AMBULATORY_CARE_PROVIDER_SITE_OTHER): Payer: Medicare Other

## 2022-01-14 ENCOUNTER — Ambulatory Visit (INDEPENDENT_AMBULATORY_CARE_PROVIDER_SITE_OTHER): Payer: Medicare Other | Admitting: Physician Assistant

## 2022-01-14 VITALS — BP 110/72 | HR 65 | Ht 64.0 in | Wt 86.0 lb

## 2022-01-14 DIAGNOSIS — R110 Nausea: Secondary | ICD-10-CM

## 2022-01-14 DIAGNOSIS — R197 Diarrhea, unspecified: Secondary | ICD-10-CM

## 2022-01-14 DIAGNOSIS — R627 Adult failure to thrive: Secondary | ICD-10-CM

## 2022-01-14 DIAGNOSIS — Z931 Gastrostomy status: Secondary | ICD-10-CM | POA: Diagnosis not present

## 2022-01-14 DIAGNOSIS — R7989 Other specified abnormal findings of blood chemistry: Secondary | ICD-10-CM

## 2022-01-14 LAB — CBC WITH DIFFERENTIAL/PLATELET
Basophils Absolute: 0 10*3/uL (ref 0.0–0.1)
Basophils Relative: 0.6 % (ref 0.0–3.0)
Eosinophils Absolute: 0.3 10*3/uL (ref 0.0–0.7)
Eosinophils Relative: 5.4 % — ABNORMAL HIGH (ref 0.0–5.0)
HCT: 42.1 % (ref 36.0–46.0)
Hemoglobin: 13.6 g/dL (ref 12.0–15.0)
Lymphocytes Relative: 14.1 % (ref 12.0–46.0)
Lymphs Abs: 0.8 10*3/uL (ref 0.7–4.0)
MCHC: 32.3 g/dL (ref 30.0–36.0)
MCV: 95.1 fl (ref 78.0–100.0)
Monocytes Absolute: 0.4 10*3/uL (ref 0.1–1.0)
Monocytes Relative: 7.6 % (ref 3.0–12.0)
Neutro Abs: 4.2 10*3/uL (ref 1.4–7.7)
Neutrophils Relative %: 72.3 % (ref 43.0–77.0)
Platelets: 173 10*3/uL (ref 150.0–400.0)
RBC: 4.43 Mil/uL (ref 3.87–5.11)
RDW: 14.9 % (ref 11.5–15.5)
WBC: 5.8 10*3/uL (ref 4.0–10.5)

## 2022-01-14 LAB — COMPREHENSIVE METABOLIC PANEL
ALT: 56 U/L — ABNORMAL HIGH (ref 0–35)
AST: 52 U/L — ABNORMAL HIGH (ref 0–37)
Albumin: 3.9 g/dL (ref 3.5–5.2)
Alkaline Phosphatase: 72 U/L (ref 39–117)
BUN: 21 mg/dL (ref 6–23)
CO2: 33 mEq/L — ABNORMAL HIGH (ref 19–32)
Calcium: 9 mg/dL (ref 8.4–10.5)
Chloride: 99 mEq/L (ref 96–112)
Creatinine, Ser: 0.57 mg/dL (ref 0.40–1.20)
GFR: 88.27 mL/min (ref >60.00)
Glucose, Bld: 99 mg/dL (ref 70–99)
Potassium: 4.4 mEq/L (ref 3.5–5.1)
Sodium: 138 mEq/L (ref 135–145)
Total Bilirubin: 0.5 mg/dL (ref 0.2–1.2)
Total Protein: 6.5 g/dL (ref 6.0–8.3)

## 2022-01-14 NOTE — Patient Instructions (Addendum)
Your provider has requested that you go to the basement level for lab work before leaving today. Press "B" on the elevator. The lab is located at the first door on the left as you exit the elevator.   We will send in a [facility] Referral and Nutrient referral, hopefully they will be contacting you soon  Due to recent changes in healthcare laws, you may see the results of your imaging and laboratory studies on MyChart before your provider has had a chance to review them.  We understand that in some cases there may be results that are confusing or concerning to you. Not all laboratory results come back in the same time frame and the provider may be waiting for multiple results in order to interpret others.  Please give [place] 48 hours in order for your provider to thoroughly review all the results before contacting the office for clarification of your results.    I appreciate the  opportunity to care for you  Thank You   [facility]

## 2022-01-15 NOTE — Progress Notes (Signed)
Agree with assessment/plan.  Raj Florestine Carmical, MD [facility] [phone]  

## 2022-01-21 DIAGNOSIS — E890 Postprocedural hypothyroidism: Secondary | ICD-10-CM | POA: Diagnosis not present

## 2022-01-21 DIAGNOSIS — G301 Alzheimer's disease with late onset: Secondary | ICD-10-CM | POA: Diagnosis not present

## 2022-01-21 DIAGNOSIS — R1310 Dysphagia, unspecified: Secondary | ICD-10-CM | POA: Diagnosis not present

## 2022-01-21 DIAGNOSIS — M3320 Polymyositis, organ involvement unspecified: Secondary | ICD-10-CM | POA: Diagnosis not present

## 2022-01-21 DIAGNOSIS — E785 Hyperlipidemia, unspecified: Secondary | ICD-10-CM | POA: Diagnosis not present

## 2022-01-21 DIAGNOSIS — R630 Anorexia: Secondary | ICD-10-CM | POA: Diagnosis not present

## 2022-01-21 DIAGNOSIS — R1319 Other dysphagia: Secondary | ICD-10-CM | POA: Diagnosis not present

## 2022-01-21 DIAGNOSIS — F0280 Dementia in other diseases classified elsewhere without behavioral disturbance: Secondary | ICD-10-CM | POA: Diagnosis not present

## 2022-01-21 DIAGNOSIS — I10 Essential (primary) hypertension: Secondary | ICD-10-CM | POA: Diagnosis not present

## 2022-01-21 DIAGNOSIS — N2889 Other specified disorders of kidney and ureter: Secondary | ICD-10-CM | POA: Diagnosis not present

## 2022-01-23 DIAGNOSIS — Z23 Encounter for immunization: Secondary | ICD-10-CM | POA: Diagnosis not present

## 2022-01-25 ENCOUNTER — Other Ambulatory Visit: Payer: Medicare Other

## 2022-01-25 ENCOUNTER — Telehealth: Payer: Self-pay | Admitting: Gastroenterology

## 2022-01-25 DIAGNOSIS — R197 Diarrhea, unspecified: Secondary | ICD-10-CM | POA: Diagnosis not present

## 2022-01-25 NOTE — Telephone Encounter (Signed)
Thersa Salt called regarding this patient.  She said she faxed an order here to the fax [phone] for orders for this patient to have a change in her feeding tube formula as well as to get a feeding pump.  She was calling to see if we had received that order.  Please call Ms. Arnoldo Morale and advise.  Her phone number is [phone].  Thank you.

## 2022-01-26 ENCOUNTER — Telehealth: Payer: Self-pay | Admitting: Gastroenterology

## 2022-01-26 LAB — CLOSTRIDIUM DIFFICILE TOXIN B, QUALITATIVE, REAL-TIME PCR: Toxigenic C. Difficile by PCR: NOT DETECTED

## 2022-01-26 NOTE — Telephone Encounter (Signed)
Patient's daughter called, requesting a call back as soon as possible on [phone]. Please call to advise.

## 2022-01-26 NOTE — Telephone Encounter (Unsigned)
Spoke with Thersa Salt: Maudie Mercury was notified that we have not received fax. Kim requested that fax be sent to [phone]: Maudie Mercury stated that she is currently out of the office but will resend fax this afternoon:  Maudie Mercury verbalized understanding with all questions answered.

## 2022-01-26 NOTE — Telephone Encounter (Signed)
Spoke with Pt daughter Levada Dy: Levada Dy stated that she was calling in regard to the fax that was sent over from Thersa Salt ; her moms dietitian: Levada Dy was notified that I have spoken to Maudie Mercury and that Maudie Mercury is going to resend fax over to our office: Levada Dy  verbalized understanding with all questions answered.

## 2022-01-27 ENCOUNTER — Telehealth: Payer: Self-pay

## 2022-01-27 ENCOUNTER — Ambulatory Visit (HOSPITAL_COMMUNITY)
Admission: RE | Admit: 2022-01-27 | Discharge: 2022-01-27 | Disposition: A | Discharge status: Home / Self Care | Payer: Medicare Other | Source: Ambulatory Visit | Attending: Physician Assistant | Admitting: Physician Assistant

## 2022-01-27 ENCOUNTER — Other Ambulatory Visit: Payer: Self-pay

## 2022-01-27 DIAGNOSIS — R110 Nausea: Secondary | ICD-10-CM | POA: Insufficient documentation

## 2022-01-27 DIAGNOSIS — K828 Other specified diseases of gallbladder: Secondary | ICD-10-CM

## 2022-01-27 DIAGNOSIS — R7989 Other specified abnormal findings of blood chemistry: Secondary | ICD-10-CM

## 2022-01-27 DIAGNOSIS — R197 Diarrhea, unspecified: Secondary | ICD-10-CM | POA: Insufficient documentation

## 2022-01-27 DIAGNOSIS — R9389 Abnormal findings on diagnostic imaging of other specified body structures: Secondary | ICD-10-CM

## 2022-01-27 DIAGNOSIS — R112 Nausea with vomiting, unspecified: Secondary | ICD-10-CM | POA: Diagnosis not present

## 2022-01-27 DIAGNOSIS — R627 Adult failure to thrive: Secondary | ICD-10-CM

## 2022-01-27 DIAGNOSIS — R945 Abnormal results of liver function studies: Secondary | ICD-10-CM | POA: Diagnosis not present

## 2022-01-27 DIAGNOSIS — K824 Cholesterolosis of gallbladder: Secondary | ICD-10-CM | POA: Diagnosis not present

## 2022-01-27 NOTE — Telephone Encounter (Signed)
Received call from [facility] that [place] results are available & PA will need to review.

## 2022-01-27 NOTE — Telephone Encounter (Signed)
Document received from Thersa Salt: Signed by Dr. Lyndel Safe and pt most recent office notes faxed to [phone]:

## 2022-01-27 NOTE — Telephone Encounter (Signed)
Looks like a Dr. Lyndel Safe patient.  I will forward this to Dr. Lyndel Safe

## 2022-01-27 NOTE — Telephone Encounter (Signed)
Clinical Notes faxed to [facility] earlier today. Kim contacted to ensure that she has received notes: Maudie Mercury stated that she has received the Fax:  Maudie Mercury verbalized understanding with all questions answered.

## 2022-01-27 NOTE — Telephone Encounter (Signed)
Spoke with patient's daughter Janace Hoard regarding results & recommendations. Orders placed for MRCP & labs. She is requesting that lab orders be sent to PCP & MRCP orders sent to [place] due to it being a closer locations. Orders faxed. She also mentioned that Thersa Salt who handles patient's feedings is requesting clinical office notes for medicare purposes. Remo Lipps, RN is currently working on this.

## 2022-01-28 DIAGNOSIS — H2513 Age-related nuclear cataract, bilateral: Secondary | ICD-10-CM | POA: Diagnosis not present

## 2022-01-29 LAB — FECAL FAT, QUALITATIVE
Fat Qual Neutral, Stl: NORMAL
Fat Qual Total, Stl: NORMAL

## 2022-02-02 LAB — PANCREATIC ELASTASE, FECAL: Pancreatic Elastase-1, Stool: >500 mcg/g

## 2022-02-10 ENCOUNTER — Other Ambulatory Visit: Payer: Self-pay

## 2022-02-10 DIAGNOSIS — K649 Unspecified hemorrhoids: Secondary | ICD-10-CM | POA: Insufficient documentation

## 2022-02-10 DIAGNOSIS — N952 Postmenopausal atrophic vaginitis: Secondary | ICD-10-CM | POA: Insufficient documentation

## 2022-02-10 DIAGNOSIS — H903 Sensorineural hearing loss, bilateral: Secondary | ICD-10-CM | POA: Insufficient documentation

## 2022-02-10 DIAGNOSIS — N3281 Overactive bladder: Secondary | ICD-10-CM | POA: Insufficient documentation

## 2022-02-10 DIAGNOSIS — K449 Diaphragmatic hernia without obstruction or gangrene: Secondary | ICD-10-CM | POA: Insufficient documentation

## 2022-02-10 DIAGNOSIS — R42 Dizziness and giddiness: Secondary | ICD-10-CM | POA: Insufficient documentation

## 2022-02-10 DIAGNOSIS — K602 Anal fissure, unspecified: Secondary | ICD-10-CM | POA: Insufficient documentation

## 2022-02-10 DIAGNOSIS — R32 Unspecified urinary incontinence: Secondary | ICD-10-CM | POA: Insufficient documentation

## 2022-02-10 DIAGNOSIS — M1990 Unspecified osteoarthritis, unspecified site: Secondary | ICD-10-CM | POA: Insufficient documentation

## 2022-02-10 DIAGNOSIS — K219 Gastro-esophageal reflux disease without esophagitis: Secondary | ICD-10-CM | POA: Insufficient documentation

## 2022-02-11 ENCOUNTER — Ambulatory Visit: Payer: Medicare Other | Attending: Cardiology | Admitting: Cardiology

## 2022-02-11 ENCOUNTER — Encounter: Payer: Self-pay | Admitting: Cardiology

## 2022-02-11 VITALS — BP 106/62 | HR 76 | Ht 63.0 in | Wt 88.2 lb

## 2022-02-11 DIAGNOSIS — K828 Other specified diseases of gallbladder: Secondary | ICD-10-CM | POA: Insufficient documentation

## 2022-02-11 DIAGNOSIS — R413 Other amnesia: Secondary | ICD-10-CM | POA: Insufficient documentation

## 2022-02-11 DIAGNOSIS — R7989 Other specified abnormal findings of blood chemistry: Secondary | ICD-10-CM | POA: Insufficient documentation

## 2022-02-11 DIAGNOSIS — R9389 Abnormal findings on diagnostic imaging of other specified body structures: Secondary | ICD-10-CM | POA: Diagnosis not present

## 2022-02-11 DIAGNOSIS — I10 Essential (primary) hypertension: Secondary | ICD-10-CM | POA: Insufficient documentation

## 2022-02-11 NOTE — Patient Instructions (Signed)
Medication Instructions:  Your physician has recommended you make the following change in your medication:   Stop Amlodipine  *If you need a refill on your cardiac medications before your next appointment, please call your pharmacy*   Lab Work: None ordered If you have labs (blood work) drawn today and your tests are completely normal, you will receive your results only by: [place] (if you have MyChart) OR A paper copy in the mail If you have any lab test that is abnormal or we need to change your treatment, we will call you to review the results.   Testing/Procedures: None ordered   Follow-Up: At [facility], you and your health needs are our priority.  As part of our continuing mission to provide you with exceptional heart care, we have created designated Provider Care Teams.  These Care Teams include your primary Cardiologist (physician) and Advanced Practice Providers (APPs -  Physician Assistants and Nurse Practitioners) who all work together to provide you with the care you need, when you need it.  We recommend signing up for the patient portal called "MyChart".  Sign up information is provided on this After Visit Summary.  MyChart is used to connect with patients for Virtual Visits (Telemedicine).  Patients are able to view lab/test results, encounter notes, upcoming appointments, etc.  Non-urgent messages can be sent to your provider as well.   To learn more about what you can do with MyChart, go to [email or web address].    Your next appointment:   12 month(s)  The format for your next appointment:   In Person  Provider:   Jyl Heinz, MD   Other Instructions NA

## 2022-02-11 NOTE — Progress Notes (Signed)
Cardiology Office Note:    Date:  02/11/2022   ID:  [name], DOB 01-May-1946, MRN [id number]  PCP:  Nicoletta Dress, MD  Cardiologist:  Jenean Lindau, MD   Referring MD: Nicoletta Dress, MD    ASSESSMENT:    1. Hypertension, essential, benign   2. Memory loss    PLAN:    In order of problems listed above:  Primary prevention stressed with the patient.  Importance of compliance with diet medication stressed and she vocalized understanding.  She was advised to ambulate and walk to the best of her ability. Essential hypertension: Because of her nutritional status her blood pressures are running pretty low.  She is taking 2.5 mg of amlodipine which I do not feel is necessary at this time.  It is giving her significantly low blood pressure and I will discontinue that medication. Nonsustained ventricular tachycardia: Patient prefers medical management and she has had no palpitations dizziness or syncope.  Ejection fraction is normal.  I respect her wishes.  Blood work is followed regularly by primary care. Patient will be seen in follow-up appointment in 6 months or earlier if the patient has any concerns    Medication Adjustments/Labs and Tests Ordered: Current medicines are reviewed at length with the patient today.  Concerns regarding medicines are outlined above.  No orders of the defined types were placed in this encounter.  No orders of the defined types were placed in this encounter.    No chief complaint on file.    History of Present Illness:    [name] is a 76 y.o. female.  Patient has past medical history of essential hypertension.  She has history of memory loss.  She is here for follow-up with her daughter accompanying her.  Her daughter is very supportive.  Patient denies any chest pain orthopnea or PND.  Her significant problem is that she has issues with swallowing and therefore is being fed by her tube at this time.  Patient herself denies any  other problems.  At the time of my evaluation, the patient is alert awake oriented and in no distress.  Past Medical History:  Diagnosis Date   Abnormal EKG 08/05/2021   Adnexal mass 03/17/2020   Formatting of this note might be different from the original. Added automatically from request for surgery [id number]   Anal fissure    Arthritis    Atrophic vaginitis    BPPV (benign paroxysmal positional vertigo)    Cardiac murmur 08/05/2021   Chronic sinusitis    DDD (degenerative disc disease), lumbar    Diarrhea    Esophageal dysphagia    Heart block    Hemorrhoids    Hiatal hernia with GERD    Hyperlipidemia LDL goal <100    Hypertension, essential, benign    Memory loss    Mobitz II    Osteoarthritis    Overactive bladder    Polymyositis (HCC)    Postsurgical hypothyroidism    Raynaud's phenomenon    Sensorineural hearing loss (SNHL), bilateral    Tremor    Urinary incontinence    Vertigo     Past Surgical History:  Procedure Laterality Date   BILATERAL OOPHORECTOMY     CARPAL TUNNEL RELEASE     CATARACT EXTRACTION, BILATERAL     COLONOSCOPY  01/04/2012   Dr Lyda Jester. Diminutive rectal polyp (1). No source of signicifant GI blood loss identified   ESOPHAGOGASTRODUODENOSCOPY  10/11/2019   Dr Lyda Jester. Diminutive antral ulcers (  2) Hiatal hernia. Empiric esophageal dilation to 50 [place]   HERNIA REPAIR  06/2021   IR GASTROSTOMY TUBE MOD SED  12/03/2021   IR REPLACE G-TUBE SIMPLE WO FLUORO  01/02/2022   THYROIDECTOMY     TOTAL KNEE ARTHROPLASTY Right    TUBAL LIGATION      Current Medications: Current Meds  Medication Sig   amLODipine (NORVASC) 5 MG tablet Take 2.5 mg by mouth daily.   Biotin 10000 MCG TABS Take 10,000 mcg by mouth daily.   cholecalciferol (VITAMIN D) 25 MCG (1000 UNIT) tablet Take 1,000 Units by mouth daily.   donepezil (ARICEPT) 10 MG tablet Take 10 mg by mouth at bedtime.   folic acid (FOLVITE) 1 MG tablet Take 1 mg by mouth daily.    levothyroxine (SYNTHROID) 125 MCG tablet Take 125 mcg by mouth daily.   methotrexate 2.5 MG tablet Take 10 mg by mouth once a week. Caution:Chemotherapy. Protect from light.    mirtazapine (REMERON) 7.5 MG tablet Take 7.5 mg by mouth at bedtime.   Multiple Vitamin (MULTIVITAMINS PO) Take 1 tablet by mouth daily.   omeprazole (PRILOSEC) 20 MG capsule Take 1 capsule (20 mg total) by mouth daily.     Allergies:   Patient has no known allergies.   Social History   Socioeconomic History   Marital status: Married    Spouse name: Not on file   Number of children: 3   Years of education: Not on file   Highest education level: Not on file  Occupational History   Occupation: Retired  Tobacco Use   Smoking status: Never   Smokeless tobacco: Never  Vaping Use   Vaping Use: Never used  Substance and Sexual Activity   Alcohol use: Never   Drug use: Never   Sexual activity: Not on file  Other Topics Concern   Not on file  Social History Narrative   Not on file   Social Determinants of Health   Financial Resource Strain: Not on file  Food Insecurity: Not on file  Transportation Needs: Not on file  Physical Activity: Not on file  Stress: Not on file  Social Connections: Not on file     Family History: The patient's family history includes Diabetes in her mother; Heart disease in her mother. There is no history of Colon cancer, Stomach cancer, Esophageal cancer, or Colon polyps.  ROS:   Please see the history of present illness.    All other systems reviewed and are negative.  EKGs/Labs/Other Studies Reviewed:    The following studies were reviewed today: EKG reveals sinus rhythm and nonspecific ST-T changes   Recent Labs: 01/14/2022: ALT 56; BUN 21; Creatinine, Ser 0.57; Hemoglobin 13.6; Platelets 173.0; Potassium 4.4; Sodium 138  Recent Lipid Panel No results found for: "CHOL", "TRIG", "HDL", "CHOLHDL", "VLDL", "LDLCALC", "LDLDIRECT"  Physical Exam:    VS:  BP 106/62    Pulse 76   Ht '5\' 3"'$  (1.6 m)   Wt 88 lb 3.2 oz (40 kg)   SpO2 97%   BMI 15.62 kg/m     Wt Readings from Last 3 Encounters:  02/11/22 88 lb 3.2 oz (40 kg)  01/14/22 86 lb (39 kg)  12/03/21 86 lb (39 kg)     GEN: Patient is in no acute distress HEENT: Normal NECK: No JVD; No carotid bruits LYMPHATICS: No lymphadenopathy CARDIAC: Hear sounds regular, 2/6 systolic murmur at the apex. RESPIRATORY:  Clear to auscultation without rales, wheezing or rhonchi  ABDOMEN: Soft, non-tender,  non-distended MUSCULOSKELETAL:  No edema; No deformity  SKIN: Warm and dry NEUROLOGIC:  Alert and oriented x 3 PSYCHIATRIC:  Normal affect   Signed, Jenean Lindau, MD  02/11/2022 9:42 AM    [place]

## 2022-02-12 DIAGNOSIS — K820 Obstruction of gallbladder: Secondary | ICD-10-CM | POA: Diagnosis not present

## 2022-02-12 DIAGNOSIS — N2889 Other specified disorders of kidney and ureter: Secondary | ICD-10-CM | POA: Diagnosis not present

## 2022-02-12 DIAGNOSIS — R601 Generalized edema: Secondary | ICD-10-CM | POA: Diagnosis not present

## 2022-02-12 LAB — LIPASE: Lipase: 47 U/L (ref 14–85)

## 2022-02-12 LAB — CEA: CEA: 4.3 ng/mL (ref 0.0–4.7)

## 2022-02-12 LAB — CANCER ANTIGEN 19-9: CA 19-9: 13 U/mL (ref 0–35)

## 2022-02-18 DIAGNOSIS — R630 Anorexia: Secondary | ICD-10-CM | POA: Diagnosis not present

## 2022-02-18 DIAGNOSIS — F0280 Dementia in other diseases classified elsewhere without behavioral disturbance: Secondary | ICD-10-CM | POA: Diagnosis not present

## 2022-02-18 DIAGNOSIS — Z931 Gastrostomy status: Secondary | ICD-10-CM | POA: Diagnosis not present

## 2022-02-18 DIAGNOSIS — R1319 Other dysphagia: Secondary | ICD-10-CM | POA: Diagnosis not present

## 2022-02-18 DIAGNOSIS — E890 Postprocedural hypothyroidism: Secondary | ICD-10-CM | POA: Diagnosis not present

## 2022-02-18 DIAGNOSIS — I10 Essential (primary) hypertension: Secondary | ICD-10-CM | POA: Diagnosis not present

## 2022-02-18 DIAGNOSIS — M3320 Polymyositis, organ involvement unspecified: Secondary | ICD-10-CM | POA: Diagnosis not present

## 2022-02-18 DIAGNOSIS — G301 Alzheimer's disease with late onset: Secondary | ICD-10-CM | POA: Diagnosis not present

## 2022-02-19 ENCOUNTER — Telehealth: Payer: Self-pay | Admitting: Gastroenterology

## 2022-02-19 NOTE — Telephone Encounter (Signed)
Left message for pt's daughter to call back.

## 2022-02-19 NOTE — Telephone Encounter (Signed)
Pt daughter Gustavo Lah stated that she had recent MRI faxed over and requesting MD to look at report and please advise in regard to number 4 under the Impression: Please review and advise: Copies were made and sent to be scanned into EPIC

## 2022-02-19 NOTE — Telephone Encounter (Signed)
Patient's daughter called, states she would like to speak to a nurse regarding patient's MRI. Please call to advise.

## 2022-02-22 ENCOUNTER — Other Ambulatory Visit: Payer: Self-pay

## 2022-02-22 DIAGNOSIS — Z931 Gastrostomy status: Secondary | ICD-10-CM

## 2022-02-22 NOTE — Telephone Encounter (Signed)
Order was sent to IR to evaluate malpositioned G tube: Pt daughter made Levada Dy made aware:

## 2022-02-24 ENCOUNTER — Telehealth: Payer: Self-pay

## 2022-02-24 NOTE — Telephone Encounter (Signed)
     Received previous message in secure chat Orders faxed to [facility]: Left message for pt daughter Gustavo Lah  to call back:

## 2022-02-25 ENCOUNTER — Telehealth: Payer: Self-pay | Admitting: Gastroenterology

## 2022-02-25 NOTE — Telephone Encounter (Signed)
[name] from [facility] called states she needs few things in order to proceed with patient on their ends. States the need ICD 10 code, patient history and physical, medications including allergies. FAX #[phone]. Also requesting a call on [phone].

## 2022-02-25 NOTE — Telephone Encounter (Signed)
Spoke with Pt daughter to ensure that it was OK for the Dr. to have to G tube looked at IR in [facility]: Pt stated that is was OK for her to go there: Spoke with Manuela Schwartz from [facility]: Documents faxed as requested to [facility] [phone]

## 2022-02-26 DIAGNOSIS — Z431 Encounter for attention to gastrostomy: Secondary | ICD-10-CM | POA: Diagnosis not present

## 2022-02-26 DIAGNOSIS — R197 Diarrhea, unspecified: Secondary | ICD-10-CM | POA: Diagnosis not present

## 2022-02-26 DIAGNOSIS — Z931 Gastrostomy status: Secondary | ICD-10-CM | POA: Diagnosis not present

## 2022-02-26 DIAGNOSIS — R110 Nausea: Secondary | ICD-10-CM | POA: Diagnosis not present

## 2022-02-26 NOTE — Telephone Encounter (Signed)
Spoke with Pt daughter yesterday and Faxed more additional notes to [facility]:  Pt daughter Levada Dy verbalized understanding with all questions answered.  Documented in additional phone note

## 2022-03-18 ENCOUNTER — Ambulatory Visit: Payer: Medicare Other | Admitting: Neurology

## 2022-03-18 DIAGNOSIS — J310 Chronic rhinitis: Secondary | ICD-10-CM | POA: Diagnosis not present

## 2022-03-18 DIAGNOSIS — R1319 Other dysphagia: Secondary | ICD-10-CM | POA: Diagnosis not present

## 2022-03-20 ENCOUNTER — Emergency Department (HOSPITAL_COMMUNITY): Payer: Medicare Other

## 2022-03-20 ENCOUNTER — Encounter (HOSPITAL_COMMUNITY): Payer: Self-pay

## 2022-03-20 ENCOUNTER — Emergency Department (HOSPITAL_COMMUNITY)
Admission: EM | Admit: 2022-03-20 | Discharge: 2022-03-20 | Disposition: A | Discharge status: Home / Self Care | Payer: Medicare Other | Attending: Emergency Medicine | Admitting: Emergency Medicine

## 2022-03-20 ENCOUNTER — Other Ambulatory Visit: Payer: Self-pay

## 2022-03-20 DIAGNOSIS — K9423 Gastrostomy malfunction: Secondary | ICD-10-CM

## 2022-03-20 DIAGNOSIS — T85598A Other mechanical complication of other gastrointestinal prosthetic devices, implants and grafts, initial encounter: Secondary | ICD-10-CM

## 2022-03-20 HISTORY — PX: IR REPLC GASTRO/COLONIC TUBE PERCUT W/FLUORO: IMG2333

## 2022-03-20 HISTORY — PX: IR REPLACE G-TUBE SIMPLE WO FLUORO: IMG2323

## 2022-03-20 MED ORDER — LIDOCAINE VISCOUS HCL 2 % MT SOLN
OROMUCOSAL | Status: AC
  Administered 2022-03-20: 5 mL
  Filled 2022-03-20: qty 15

## 2022-03-20 MED ORDER — IOHEXOL 300 MG/ML  SOLN
50.0000 mL | Freq: Once | INTRAMUSCULAR | Status: DC | PRN
Start: 2022-03-20 — End: 2022-03-20

## 2022-03-20 NOTE — ED Notes (Signed)
Pt to Interventional Radiology for G tube placement

## 2022-03-20 NOTE — ED Triage Notes (Signed)
Patient said she woke up around 4am and noticed her feeding tube came out. Has had it placed since June. 16 french size.

## 2022-03-20 NOTE — Procedures (Signed)
Interventional Radiology Procedure Note  Procedure: Fluoroscopic guided gastrostomy tube replacement  Findings: Please refer to procedural dictation for full description. 78 Fr balloon retention gastrostomy placed.  20 sterile water in retention balloon.  Complications: None immediate  Estimated Blood Loss: None  Recommendations: Tube ready for immediate use.   Ruthann Cancer, MD

## 2022-03-20 NOTE — Discharge Instructions (Signed)
Follow-up with your doctor.  Use the tube feeds as prescribed.  Return to the ED with new or worsening symptoms.

## 2022-03-20 NOTE — ED Provider Notes (Signed)
[facility] DEPT Provider Note   CSN: [id number] Arrival date & time: 03/20/22  3810     History  Chief Complaint  Patient presents with   Feeding Tube Displacement    [name] is a 76 y.o. female.  Patient here with displaced G-tube.  States it fell out sometime between 4 and 5 AM when she noticed her bedding was wet.  Has not seen any bleeding.  She has the tube since June 29 for chronic dysphagia and weight loss.  She does eat by mouth also.  No recent vomiting or fever.  No abdominal pain.  Tube was displaced in July and required IR replacement.  The history is provided by the patient.       Home Medications Prior to Admission medications   Medication Sig Start Date End Date Taking? Authorizing Provider  Biotin 10000 MCG TABS Take 10,000 mcg by mouth daily.    [provider]  cholecalciferol (VITAMIN D) 25 MCG (1000 UNIT) tablet Take 1,000 Units by mouth daily.    [provider]  donepezil (ARICEPT) 10 MG tablet Take 10 mg by mouth at bedtime. 01/15/22   [provider]  folic acid (FOLVITE) 1 MG tablet Take 1 mg by mouth daily. 07/09/21   [provider]  levothyroxine (SYNTHROID) 125 MCG tablet Take 125 mcg by mouth daily. 01/22/22   [provider]  methotrexate 2.5 MG tablet Take 10 mg by mouth once a week. Caution:Chemotherapy. Protect from light.     [provider]  mirtazapine (REMERON) 7.5 MG tablet Take 7.5 mg by mouth at bedtime. 08/02/21   [provider]  Multiple Vitamin (MULTIVITAMINS PO) Take 1 tablet by mouth daily.    [provider]  omeprazole (PRILOSEC) 20 MG capsule Take 1 capsule (20 mg total) by mouth daily. 11/25/21   Jackquline Denmark, MD      Allergies    Patient has no known allergies.    Review of Systems   Review of Systems  Constitutional:  Negative for fever.  Respiratory:  Negative for cough and shortness of breath.   Gastrointestinal:   Negative for abdominal pain, nausea and vomiting.  Musculoskeletal:  Negative for arthralgias and myalgias.   all other systems are negative except as noted in the HPI and PMH.    Physical Exam Updated Vital Signs BP (!) 141/72 (BP Location: Left Arm)   Pulse 74   Temp 98.5 F (36.9 C) (Oral)   Resp 18   Ht '5\' 3"'$  (1.6 m)   Wt 42.2 kg   SpO2 95%   BMI 16.47 kg/m  Physical Exam Vitals and nursing note reviewed.  Constitutional:      General: She is not in acute distress.    Appearance: She is well-developed.  HENT:     Head: Normocephalic and atraumatic.     Mouth/Throat:     Pharynx: No oropharyngeal exudate.  Eyes:     Conjunctiva/sclera: Conjunctivae normal.     Pupils: Pupils are equal, round, and reactive to light.  Neck:     Comments: No meningismus. Cardiovascular:     Rate and Rhythm: Normal rate and regular rhythm.     Heart sounds: Normal heart sounds. No murmur heard. Pulmonary:     Effort: Pulmonary effort is normal. No respiratory distress.     Breath sounds: Normal breath sounds.  Abdominal:     Palpations: Abdomen is soft.     Tenderness: There is no abdominal  tenderness. There is no guarding or rebound.     Comments: G tube site mildly erythematous  Musculoskeletal:        General: No tenderness. Normal range of motion.     Cervical back: Normal range of motion and neck supple.  Skin:    General: Skin is warm.  Neurological:     Mental Status: She is alert and oriented to person, place, and time.     Cranial Nerves: No cranial nerve deficit.     Motor: No abnormal muscle tone.     Coordination: Coordination normal.     Comments:  5/5 strength throughout. CN 2-12 intact.Equal grip strength.   Psychiatric:        Behavior: Behavior normal.     ED Results / Procedures / Treatments   Labs (all labs ordered are listed, but only abnormal results are displayed) Labs Reviewed - No data to display  EKG None  Radiology IR REPLACE G-TUBE SIMPLE WO  FLUORO  Result Date: 03/20/2022 INDICATION: 76 year old female with history of severe dysphagia and bili to thrive status post percutaneous gastrostomy tube placement on 12/03/2021 presenting after accidental removal of indwelling gastrostomy tube. EXAM: Fluoroscopic guided gastrostomy tube replacement MEDICATIONS: None. ANESTHESIA/SEDATION: None. CONTRAST:  10 mL Omnipaque 300-administered into the gastric lumen. FLUOROSCOPY: Radiation Exposure Index (as provided by the fluoroscopic device): 0.8 minutes mGy Kerma COMPLICATIONS: None immediate. PROCEDURE: Informed written consent was obtained from the patient after a thorough discussion of the procedural risks, benefits and alternatives. All questions were addressed. Maximal Sterile Barrier Technique was utilized including caps, mask, sterile gowns, sterile gloves, sterile drape, hand hygiene and skin antiseptic. A timeout was performed prior to the initiation of the procedure. The established gastrostomy tract was prepped and draped in standard fashion. Viscous lidocaine was applied to the established gastrostomy tract. A 5 French angled tip catheter was inserted via the indwelling tract and into the stomach. A stiff Glidewire was then inserted and coiled in the gastric lumen. Serial dilation was performed with 16 and 20 [place] dilators. A 20 French balloon retention gastrostomy tube was unable to be inserted over the wire. Therefore, balloon tract dilation to 8 mm was performed with an 8 x 40 mm [employer] balloon. The gastrostomy tube was then inserted over the balloon after deflation with ease. 20 mL sterile water were used to insufflate the retention balloon. The external bumper was cinched at the level of the skin. Hand injection of contrast via the gastrostomy lumen was performed confirming placement in the stomach. The tube was then flushed with saline. A sterile bandage was applied. The patient tolerated procedure well was transferred back to the emergency  department in good condition. IMPRESSION: Technically successful replacement of 20 French balloon retention gastrostomy tube through the established percutaneous tract. Ruthann Cancer, MD [facility] Radiology Electronically Signed   By: Ruthann Cancer M.D.   On: 03/20/2022 11:07    Procedures Gastrostomy tube replacement  Date/Time: 03/20/2022 8:05 AM  Performed by: Ezequiel Essex, MD Authorized by: Ezequiel Essex, MD  Consent: Verbal consent obtained. Risks and benefits: risks, benefits and alternatives were discussed Consent given by: patient Patient understanding: patient states understanding of the procedure being performed Patient consent: the patient's understanding of the procedure matches consent given Patient identity confirmed: verbally with patient Time out: Immediately prior to procedure a "time out" was called to verify the correct patient, procedure, equipment, support staff and site/side marked as required. Preparation: Patient was prepped and draped  in the usual sterile fashion. Local anesthesia used: no  Anesthesia: Local anesthesia used: no  Sedation: Patient sedated: no  Patient tolerance: patient tolerated the procedure well with no immediate complications Comments: Unable to place 23F G tube       Medications Ordered in ED Medications - No data to display  ED Course/ Medical Decision Making/ A&P                           Medical Decision Making Amount and/or Complexity of Data Reviewed Labs: ordered. Decision-making details documented in ED Course. Radiology: ordered and independent interpretation performed. Decision-making details documented in ED Course. ECG/medicine tests: ordered and independent interpretation performed. Decision-making details documented in ED Course.  Displaced feeding tube since 4 AM.  Vital stable, no distress, abdomen soft without peritoneal signs.  Attempted to replace with  5 [place] feeding tube but there was much resistance and this was not successful.  We will attempt to replace with smaller size catheter if available.  Discussed with Dr. Serafina Royals of interventional radiology.  They will evaluate for potential replacement today.  Gastrostomy tube replaced by interventional radiology successfully.  Ready for use per them.  Patient and daughter aware of tube instructions and flushing instructions.  Return precautions discussed.       Final Clinical Impression(s) / ED Diagnoses Final diagnoses:  Malfunction of gastrostomy tube [facility])    Rx / DC Orders ED Discharge Orders     None         Diarra Ceja, Annie Main, MD 03/20/22 1534

## 2022-03-22 ENCOUNTER — Encounter (HOSPITAL_COMMUNITY): Payer: Self-pay | Admitting: Radiology

## 2022-03-26 ENCOUNTER — Telehealth: Payer: Self-pay

## 2022-03-26 NOTE — Telephone Encounter (Signed)
        Patient  visited [facility] on 10/14   Telephone encounter attempt :  1st  A HIPAA compliant voice message was left requesting a return call.  Instructed patient to call back   [place]  [phone] [address], [place], [place] [address] Phone: [phone] Email: [email or web address]

## 2022-04-11 ENCOUNTER — Emergency Department (HOSPITAL_COMMUNITY): Payer: No Typology Code available for payment source

## 2022-04-11 ENCOUNTER — Emergency Department (HOSPITAL_COMMUNITY)
Admission: EM | Admit: 2022-04-11 | Discharge: 2022-04-11 | Disposition: A | Discharge status: Home / Self Care | Payer: No Typology Code available for payment source | Attending: Emergency Medicine | Admitting: Emergency Medicine

## 2022-04-11 ENCOUNTER — Encounter (HOSPITAL_COMMUNITY): Payer: Self-pay

## 2022-04-11 DIAGNOSIS — Z789 Other specified health status: Secondary | ICD-10-CM

## 2022-04-11 DIAGNOSIS — K9423 Gastrostomy malfunction: Secondary | ICD-10-CM | POA: Insufficient documentation

## 2022-04-11 MED ORDER — IOPAMIDOL (ISOVUE-300) INJECTION 61%
30.0000 mL | Freq: Once | INTRAVENOUS | Status: AC | PRN
  Administered 2022-04-11: 30 mL

## 2022-04-11 MED ORDER — LIDOCAINE HCL URETHRAL/MUCOSAL 2 % EX GEL
1.0000 | Freq: Once | CUTANEOUS | Status: AC
  Administered 2022-04-11: 1 via TOPICAL
  Filled 2022-04-11: qty 11

## 2022-04-11 NOTE — ED Triage Notes (Signed)
Pt arrived via POV, cleaning g tube today and tube came out. NAD

## 2022-04-11 NOTE — ED Provider Notes (Signed)
[facility] DEPT Provider Note   CSN: [id number] Arrival date & time: 04/11/22  1810     History  No chief complaint on file.   LYNASIA MELOCHE is a 76 y.o. female.  Pt's daughter reports she was cleaning pt's gtube and it came out.  Daughter reports pt has problems with tube coming out. Pt denies any other complaints.  Pt has gtube for nutrition due to weight loss.   The history is provided by the patient. No language interpreter was used.       Home Medications Prior to Admission medications   Medication Sig Start Date End Date Taking? Authorizing Provider  Biotin 10000 MCG TABS Take 10,000 mcg by mouth daily.    [provider]  cholecalciferol (VITAMIN D) 25 MCG (1000 UNIT) tablet Take 1,000 Units by mouth daily.    [provider]  donepezil (ARICEPT) 10 MG tablet Take 10 mg by mouth at bedtime. 01/15/22   [provider]  folic acid (FOLVITE) 1 MG tablet Take 1 mg by mouth daily. 07/09/21   [provider]  levothyroxine (SYNTHROID) 125 MCG tablet Take 125 mcg by mouth daily. 01/22/22   [provider]  methotrexate 2.5 MG tablet Take 10 mg by mouth once a week. Caution:Chemotherapy. Protect from light.     [provider]  mirtazapine (REMERON) 7.5 MG tablet Take 7.5 mg by mouth at bedtime. 08/02/21   [provider]  Multiple Vitamin (MULTIVITAMINS PO) Take 1 tablet by mouth daily.    [provider]  omeprazole (PRILOSEC) 20 MG capsule Take 1 capsule (20 mg total) by mouth daily. 11/25/21   Jackquline Denmark, MD      Allergies    Patient has no known allergies.    Review of Systems   Review of Systems  Gastrointestinal:  Positive for abdominal pain.  All other systems reviewed and are negative.   Physical Exam Updated Vital Signs BP (!) 142/78 (BP Location: Left Arm)   Pulse 72   Temp 98.2 F (36.8 C) (Oral)   Resp 18   SpO2 100%  Physical Exam Vitals and nursing  note reviewed.  Constitutional:      Appearance: She is well-developed.  HENT:     Head: Normocephalic.  Cardiovascular:     Rate and Rhythm: Normal rate.  Pulmonary:     Effort: Pulmonary effort is normal.  Abdominal:     General: There is no distension.     Comments: No sign of infection,  small stoma, no sign of infection  Musculoskeletal:        General: Normal range of motion.     Cervical back: Normal range of motion.  Skin:    General: Skin is warm.  Neurological:     General: No focal deficit present.     Mental Status: She is alert and oriented to person, place, and time.  Psychiatric:        Mood and Affect: Mood normal.     ED Results / Procedures / Treatments   Labs (all labs ordered are listed, but only abnormal results are displayed) Labs Reviewed - No data to display  EKG None  Radiology No results found.  Procedures FEEDING TUBE REPLACEMENT  Date/Time: 04/11/2022 8:22 PM  Performed by: Fransico Meadow, PA-C Authorized by: Fransico Meadow, PA-C  Consent: Verbal consent obtained. Risks and benefits: risks, benefits and alternatives were discussed Consent given by: patient Required items: required blood products, implants,  devices, and special equipment available Time out: Immediately prior to procedure a "time out" was called to verify the correct patient, procedure, equipment, support staff and site/side marked as required. Preparation: Patient was prepped and draped in the usual sterile fashion. Indications: tube removed by patient Local anesthesia used: yes  Anesthesia: Local anesthesia used: yes Local Anesthetic: topical anesthetic  Sedation: Patient sedated: no  Tube type: gastrostomy Tube size: 16 Fr Endoscope used: no Bulb inflation volume: 5 (ml) Bulb inflation fluid: normal saline Placement/position confirmation: x-ray Tube placement difficulty: moderate Patient tolerance: patient tolerated the procedure well with no immediate  complications       Medications Ordered in ED Medications  lidocaine (XYLOCAINE) 2 % jelly 1 Application (1 Application Topical Given by Other 04/11/22 1909)    ED Course/ Medical Decision Making/ A&P                           Medical Decision Making Pt and daughter reports that they noticed feeding tube was out this am.  Amount and/or Complexity of Data Reviewed Independent Historian:     Details: Pt here with daughter  External Data Reviewed: radiology.    Details: Radiology notes reviewed  Radiology: ordered and independent interpretation performed. Decision-making details documented in ED Course.    Details: Xray ordered and reviewed for placement.  Pt has gtube in gastric anthrum   Risk Risk Details: Pt given number for IR for follow up.            Final Clinical Impression(s) / ED Diagnoses Final diagnoses:  Encounter for gastrojejunal tube placement    Rx / DC Orders ED Discharge Orders     None      An After Visit Summary was printed and given to the patient.    Sidney Ace 04/11/22 2040    Valarie Merino, MD 04/12/22 [phone]

## 2022-04-11 NOTE — Discharge Instructions (Addendum)
Call radiology to have tube replaced if any concerns

## 2022-04-11 NOTE — ED Notes (Signed)
Slitted drainage dressing placed over G-Tube site, secured with tape.

## 2022-07-15 DIAGNOSIS — I7300 Raynaud's syndrome without gangrene: Secondary | ICD-10-CM | POA: Diagnosis not present

## 2022-07-15 DIAGNOSIS — H04123 Dry eye syndrome of bilateral lacrimal glands: Secondary | ICD-10-CM | POA: Diagnosis not present

## 2022-07-15 DIAGNOSIS — R296 Repeated falls: Secondary | ICD-10-CM | POA: Diagnosis not present

## 2022-07-15 DIAGNOSIS — M5136 Other intervertebral disc degeneration, lumbar region: Secondary | ICD-10-CM | POA: Diagnosis not present

## 2022-07-15 DIAGNOSIS — Z79899 Other long term (current) drug therapy: Secondary | ICD-10-CM | POA: Diagnosis not present

## 2022-07-15 DIAGNOSIS — M3322 Polymyositis with myopathy: Secondary | ICD-10-CM | POA: Diagnosis not present

## 2022-07-15 DIAGNOSIS — R1310 Dysphagia, unspecified: Secondary | ICD-10-CM | POA: Diagnosis not present

## 2022-07-15 DIAGNOSIS — Z681 Body mass index (BMI) 19 or less, adult: Secondary | ICD-10-CM | POA: Diagnosis not present

## 2022-07-15 DIAGNOSIS — M1991 Primary osteoarthritis, unspecified site: Secondary | ICD-10-CM | POA: Diagnosis not present

## 2022-09-09 DIAGNOSIS — F0280 Dementia in other diseases classified elsewhere without behavioral disturbance: Secondary | ICD-10-CM | POA: Diagnosis not present

## 2022-09-09 DIAGNOSIS — M3320 Polymyositis, organ involvement unspecified: Secondary | ICD-10-CM | POA: Diagnosis not present

## 2022-09-09 DIAGNOSIS — E785 Hyperlipidemia, unspecified: Secondary | ICD-10-CM | POA: Diagnosis not present

## 2022-09-09 DIAGNOSIS — G301 Alzheimer's disease with late onset: Secondary | ICD-10-CM | POA: Diagnosis not present

## 2022-09-09 DIAGNOSIS — E890 Postprocedural hypothyroidism: Secondary | ICD-10-CM | POA: Diagnosis not present

## 2022-09-09 DIAGNOSIS — R1319 Other dysphagia: Secondary | ICD-10-CM | POA: Diagnosis not present

## 2022-09-09 DIAGNOSIS — L84 Corns and callosities: Secondary | ICD-10-CM | POA: Diagnosis not present

## 2022-09-09 DIAGNOSIS — Z931 Gastrostomy status: Secondary | ICD-10-CM | POA: Diagnosis not present

## 2022-09-09 DIAGNOSIS — I10 Essential (primary) hypertension: Secondary | ICD-10-CM | POA: Diagnosis not present

## 2022-09-09 DIAGNOSIS — E43 Unspecified severe protein-calorie malnutrition: Secondary | ICD-10-CM | POA: Diagnosis not present

## 2022-09-17 ENCOUNTER — Ambulatory Visit (INDEPENDENT_AMBULATORY_CARE_PROVIDER_SITE_OTHER): Payer: Medicare Other | Admitting: Podiatry

## 2022-09-17 DIAGNOSIS — L84 Corns and callosities: Secondary | ICD-10-CM | POA: Diagnosis not present

## 2022-09-17 NOTE — Progress Notes (Signed)
  Subjective:  Patient ID: [name], female    DOB: March 16, 1946,  MRN: [id number]  Chief Complaint  Patient presents with   Callouses    Callouse located on the ball of foot near the 5th digit, patient stated its hard to walk on foot     77 y.o. female presents with callus sub 5th MPJ right foot.  Causing pain with walking on the lesion.  Has been going on for couple months.  Has had a trend down in the past by Dr. Marylene Land approximately 3 years ago  Past Medical History:  Diagnosis Date   Abnormal EKG 08/05/2021   Adnexal mass 03/17/2020   Formatting of this note might be different from the original. Added automatically from request for surgery [id number]   Anal fissure    Arthritis    Atrophic vaginitis    BPPV (benign paroxysmal positional vertigo)    Cardiac murmur 08/05/2021   Chronic sinusitis    DDD (degenerative disc disease), lumbar    Diarrhea    Esophageal dysphagia    Heart block    Hemorrhoids    Hiatal hernia with GERD    Hyperlipidemia LDL goal <100    Hypertension, essential, benign    Memory loss    Mobitz II    Osteoarthritis    Overactive bladder    Polymyositis (HCC)    Postsurgical hypothyroidism    Raynaud's phenomenon    Sensorineural hearing loss (SNHL), bilateral    Tremor    Urinary incontinence    Vertigo     No Known Allergies  ROS: Negative except as per HPI above  Objective:  General: AAO x3, NAD  Dermatological: Hard hyper keratotic lesion at the plantar aspect of the fifth MPJ on the right foot with pain on palpation.  No underlying ulceration upon debridement  Vascular:  Dorsalis Pedis artery and Posterior Tibial artery pedal pulses are 2/4 bilateral.  Capillary fill time < 3 sec to all digits.   Neruologic: Grossly intact via light touch bilateral. Protective threshold intact to all sites bilateral.   Musculoskeletal: No gross boney pedal deformities bilateral. No pain, crepitus, or limitation noted with foot and ankle range of  motion bilateral. Muscular strength 5/5 in all groups tested bilateral.  Gait: Unassisted, Nonantalgic.   No images are attached to the encounter.   Assessment:   1. Callus of foot      Plan:  Patient was evaluated and treated and all questions answered.  # Hyperkeratotic lesion subfifth MPJ right foot All symptomatic hyperkeratoses were safely debrided with a sterile #15 blade to patient's level of comfort without incident. We discussed preventative and palliative care of these lesions including supportive and accommodative shoegear, padding, prefabricated and custom molded accommodative orthoses, use of a pumice stone and lotions/creams daily.   Return if symptoms worsen or fail to improve.          Corinna Gab, DPM [facility]

## 2022-10-28 DIAGNOSIS — M3322 Polymyositis with myopathy: Secondary | ICD-10-CM | POA: Diagnosis not present

## 2022-10-29 ENCOUNTER — Telehealth: Payer: Self-pay | Admitting: Gastroenterology

## 2022-10-29 NOTE — Telephone Encounter (Signed)
Inbound call from patient daughter stating patient is currently having trouble swallowing . Patient is also on a feeding tube.  Scheduled for next available f/u. Requesting a sooner apt if possible.  Please advise.thank you

## 2022-11-02 NOTE — Telephone Encounter (Signed)
Pt daughter Marylene Land  stated that her Mom is having increased difficulty swallowing which is getting worse. Requesting an EGD with Dilation: Pt was scheduled for an office visit on 11/04/2022 at 2:00 PM with Amy Esterwood PA. Angela Made aware.  Marylene Land  verbalized understanding with all questions answered.

## 2022-11-04 ENCOUNTER — Ambulatory Visit: Payer: Medicare Other | Admitting: Physician Assistant

## 2022-11-11 ENCOUNTER — Encounter: Payer: Self-pay | Admitting: Gastroenterology

## 2022-11-11 ENCOUNTER — Ambulatory Visit (INDEPENDENT_AMBULATORY_CARE_PROVIDER_SITE_OTHER): Payer: Medicare Other | Admitting: Gastroenterology

## 2022-11-11 VITALS — BP 126/70 | HR 73 | Ht 64.0 in | Wt 102.8 lb

## 2022-11-11 DIAGNOSIS — R627 Adult failure to thrive: Secondary | ICD-10-CM

## 2022-11-11 DIAGNOSIS — R1310 Dysphagia, unspecified: Secondary | ICD-10-CM | POA: Diagnosis not present

## 2022-11-11 HISTORY — DX: Adult failure to thrive: R62.7

## 2022-11-11 NOTE — Progress Notes (Signed)
11/11/2022 [name] [id number] 1945/10/23   HISTORY OF PRESENT ILLNESS:  77 y.o. female  with a past medical history of hypertension, hypothyroidism status post thyroidectomy, Mobitz type II, Raynaud's, hearing loss, vertigo, polymyositis following with Dr. Dierdre Forth on methotrexate and others listed below, returns to clinic today for evaluation of failure to thrive status post PEG tube placement.   11/25/2021 office visit with Dr. Chales Abrahams for failure to thrive.  Had profound weight loss had been on Remeron.  Speech therapy consult 01/08/2020 showed moderate oropharyngeal dysphagia.  Previous extensive negative GI workup with Dr. Jennye Boroughs.  Due to profound weight loss, failure to thrive, history of aspiration and oropharyngeal dysphagia with diagnosis of Polly myomatosis patient was sent to IR for PEG placement, nutritional consult for feedings.   12/03/2021 IR gastrotomy tube placement with IR 01/02/2022 presented to ER with dislodged percutaneous gastrostomy tube.  Was having feeds and leakage around the tube found was dislodged states the balloon was deflated. IR was able to exchange 16 French balloon retention gastrostomy tube.  Her weight is up to 102 pounds.  Her daughter is here today with her.  She says that they have really had no guidance in regards to the feeding tube and they are not really sure who should be managing it, ordering her supplies, etc.  Currently her PCP is working with home health to do it.  States that she has had progressive dysphagia to both solids and liquids as well as pills over the last several months.  She is really taking minimal amounts by mouth at this time.  She is still trying to take her pills by mouth, but they get stuck.  She really only sometimes drinks a little bit of orange juice in the mornings by mouth and that is really all she is taking by mouth.  Says if she feels like eating she will try something little, but she really has no desire to  eat and does not get hungry with her feedings running all night.  She said that she is happy and satisfied with how things are going currently does not really want to proceed with any other procedures such as endoscopy, etc.  Had extensive conversation today with the patient and her daughter.   Past Medical History:  Diagnosis Date   Abnormal EKG 08/05/2021   Adnexal mass 03/17/2020   Formatting of this note might be different from the original. Added automatically from request for surgery [id number]   Anal fissure    Arthritis    Atrophic vaginitis    BPPV (benign paroxysmal positional vertigo)    Cardiac murmur 08/05/2021   Chronic sinusitis    DDD (degenerative disc disease), lumbar    Diarrhea    Esophageal dysphagia    Heart block    Hemorrhoids    Hiatal hernia with GERD    Hyperlipidemia LDL goal <100    Hypertension, essential, benign    Memory loss    Mobitz II    Osteoarthritis    Overactive bladder    Polymyositis (HCC)    Postsurgical hypothyroidism    Raynaud's phenomenon    Sensorineural hearing loss (SNHL), bilateral    Tremor    Urinary incontinence    Vertigo    Past Surgical History:  Procedure Laterality Date   BILATERAL OOPHORECTOMY     CARPAL TUNNEL RELEASE     CATARACT EXTRACTION, BILATERAL     COLONOSCOPY  01/04/2012   Dr Jennye Boroughs. Diminutive rectal polyp (1).  No source of signicifant GI blood loss identified   ESOPHAGOGASTRODUODENOSCOPY  10/11/2019   Dr Jennye Boroughs. Diminutive antral ulcers (2) Hiatal hernia. Empiric esophageal dilation to 50 [place]   HERNIA REPAIR  06/2021   IR GASTROSTOMY TUBE MOD SED  12/03/2021   IR REPLACE G-TUBE SIMPLE WO FLUORO  01/02/2022   IR REPLC GASTRO/COLONIC TUBE PERCUT W/FLUORO  03/20/2022   THYROIDECTOMY     TOTAL KNEE ARTHROPLASTY Right    TUBAL LIGATION      reports that she has never smoked. She has never used smokeless tobacco. She reports that she does not drink alcohol and does not use drugs. family history  includes Diabetes in her mother; Heart disease in her mother. No Known Allergies    Outpatient Encounter Medications as of 11/11/2022  Medication Sig   donepezil (ARICEPT) 10 MG tablet Take 10 mg by mouth at bedtime.   folic acid (FOLVITE) 1 MG tablet Take 1 mg by mouth daily.   ipratropium (ATROVENT) 0.06 % nasal spray 2 sprays 2 (two) times a day for 30 days.   levothyroxine (SYNTHROID) 125 MCG tablet Take 125 mcg by mouth daily.   methotrexate 2.5 MG tablet Take 10 mg by mouth once a week. Caution:Chemotherapy. Protect from light.    mirtazapine (REMERON) 7.5 MG tablet Take 7.5 mg by mouth at bedtime.   omeprazole (PRILOSEC) 20 MG capsule Take 1 capsule (20 mg total) by mouth daily.   Biotin 16109 MCG TABS Take 10,000 mcg by mouth daily. (Patient not taking: Reported on 11/11/2022)   cholecalciferol (VITAMIN D) 25 MCG (1000 UNIT) tablet Take 1,000 Units by mouth daily. (Patient not taking: Reported on 11/11/2022)   Multiple Vitamin (MULTIVITAMINS PO) Take 1 tablet by mouth daily. (Patient not taking: Reported on 11/11/2022)   No facility-administered encounter medications on file as of 11/11/2022.     REVIEW OF SYSTEMS  : All other systems reviewed and negative except where noted in the History of Present Illness.   PHYSICAL EXAM: BP 126/70   Pulse 73   Ht 5\' 4"  (1.626 m)   Wt 102 lb 12.8 oz (46.6 kg)   SpO2 96%   BMI 17.65 kg/m  General: Well developed white female in no acute distress Head: Normocephalic and atraumatic Eyes:  Sclerae anicteric, conjunctiva pink. Ears: Normal auditory acuity Musculoskeletal: Symmetrical with no gross deformities  Skin: No lesions on visible extremities Extremities: No edema  Neurological: Alert oriented x 4, grossly non-focal Psychological:  Alert and cooperative. Normal mood and affect  ASSESSMENT AND PLAN: *77 year old female with failure to thrive: She is status post gastrostomy tube placement in June 2023.  Family has struggled with managing  her feedings, etc.  It looks like there was a referral placed to nutrition/dietitian last year and then her family doctor has been working with home health, but they want to make sure she is on the correct formulation, etc.  She has had intermittent dysphagia over the years with EGD in 2021 with no obstructive source, but esophagus was empirically dilated.  She's had more progressive dysphagia again recently, but I think it is likely motility issue related to her polymyositis, decreasing use of her esophagus.  With her tube feeds she does not get hungry and has no desire to eat orally.  She says that she is fine with doing what she is doing currently and does not want to proceed with another EGD with dilation to see if that helps.  I think that is appropriate.  Will  place a stat/urgent referral to nutrition/dietician.   CC:  Paulina Fusi, MD

## 2022-11-11 NOTE — Patient Instructions (Signed)
Referral placed to nutrition.   _______________________________________________________  If your blood pressure at your visit was 140/90 or greater, please contact your primary care physician to follow up on this.  _______________________________________________________  If you are age 77 or older, your body mass index should be between 23-30. Your Body mass index is 17.65 kg/m. If this is out of the aforementioned range listed, please consider follow up with your Primary Care Provider.  If you are age 81 or younger, your body mass index should be between 19-25. Your Body mass index is 17.65 kg/m. If this is out of the aformentioned range listed, please consider follow up with your Primary Care Provider.   ________________________________________________________  The [facility] providers would like to encourage you to use [facility] to communicate with providers for non-urgent requests or questions.  Due to long hold times on the telephone, sending your provider a message by [facility] may be a faster and more efficient way to get a response.  Please allow 48 business hours for a response.  Please remember that this is for non-urgent requests.  _______________________________________________________

## 2022-11-16 NOTE — Progress Notes (Signed)
Agree with assessment/plan.  Raj Nahla Lukin, MD [facility] [phone]  

## 2022-12-24 DIAGNOSIS — E785 Hyperlipidemia, unspecified: Secondary | ICD-10-CM | POA: Diagnosis not present

## 2022-12-24 DIAGNOSIS — M3320 Polymyositis, organ involvement unspecified: Secondary | ICD-10-CM | POA: Diagnosis not present

## 2022-12-24 DIAGNOSIS — E43 Unspecified severe protein-calorie malnutrition: Secondary | ICD-10-CM | POA: Diagnosis not present

## 2022-12-24 DIAGNOSIS — I10 Essential (primary) hypertension: Secondary | ICD-10-CM | POA: Diagnosis not present

## 2022-12-24 DIAGNOSIS — F0280 Dementia in other diseases classified elsewhere without behavioral disturbance: Secondary | ICD-10-CM | POA: Diagnosis not present

## 2022-12-24 DIAGNOSIS — G301 Alzheimer's disease with late onset: Secondary | ICD-10-CM | POA: Diagnosis not present

## 2022-12-24 DIAGNOSIS — Z931 Gastrostomy status: Secondary | ICD-10-CM | POA: Diagnosis not present

## 2022-12-24 DIAGNOSIS — E890 Postprocedural hypothyroidism: Secondary | ICD-10-CM | POA: Diagnosis not present

## 2022-12-24 DIAGNOSIS — R1319 Other dysphagia: Secondary | ICD-10-CM | POA: Diagnosis not present

## 2022-12-28 ENCOUNTER — Ambulatory Visit: Payer: Medicare Other | Admitting: Gastroenterology

## 2023-01-13 DIAGNOSIS — M5136 Other intervertebral disc degeneration, lumbar region: Secondary | ICD-10-CM | POA: Diagnosis not present

## 2023-01-13 DIAGNOSIS — R1310 Dysphagia, unspecified: Secondary | ICD-10-CM | POA: Diagnosis not present

## 2023-01-13 DIAGNOSIS — Z681 Body mass index (BMI) 19 or less, adult: Secondary | ICD-10-CM | POA: Diagnosis not present

## 2023-01-13 DIAGNOSIS — R296 Repeated falls: Secondary | ICD-10-CM | POA: Diagnosis not present

## 2023-01-13 DIAGNOSIS — M1991 Primary osteoarthritis, unspecified site: Secondary | ICD-10-CM | POA: Diagnosis not present

## 2023-01-13 DIAGNOSIS — M3322 Polymyositis with myopathy: Secondary | ICD-10-CM | POA: Diagnosis not present

## 2023-01-13 DIAGNOSIS — I7300 Raynaud's syndrome without gangrene: Secondary | ICD-10-CM | POA: Diagnosis not present

## 2023-01-13 DIAGNOSIS — Z79899 Other long term (current) drug therapy: Secondary | ICD-10-CM | POA: Diagnosis not present

## 2023-01-13 DIAGNOSIS — H04123 Dry eye syndrome of bilateral lacrimal glands: Secondary | ICD-10-CM | POA: Diagnosis not present

## 2023-01-18 ENCOUNTER — Telehealth: Payer: Self-pay

## 2023-01-18 NOTE — Telephone Encounter (Signed)
Received fax from [facility]  requesting Dr. Lenox Ponds along with recent office notes. Documents were signed and faxed back along with recent office notes to [phone]. Conformation page received.  Copy was made and sent to be scanned into Epic.

## 2023-01-28 DIAGNOSIS — B079 Viral wart, unspecified: Secondary | ICD-10-CM | POA: Diagnosis not present

## 2023-01-28 DIAGNOSIS — D485 Neoplasm of uncertain behavior of skin: Secondary | ICD-10-CM | POA: Diagnosis not present

## 2023-01-28 DIAGNOSIS — L820 Inflamed seborrheic keratosis: Secondary | ICD-10-CM | POA: Diagnosis not present

## 2023-01-28 DIAGNOSIS — L570 Actinic keratosis: Secondary | ICD-10-CM | POA: Diagnosis not present

## 2023-02-04 DIAGNOSIS — R059 Cough, unspecified: Secondary | ICD-10-CM | POA: Diagnosis not present

## 2023-02-04 DIAGNOSIS — Z4682 Encounter for fitting and adjustment of non-vascular catheter: Secondary | ICD-10-CM | POA: Diagnosis not present

## 2023-02-04 DIAGNOSIS — N3000 Acute cystitis without hematuria: Secondary | ICD-10-CM | POA: Diagnosis not present

## 2023-02-04 DIAGNOSIS — Z431 Encounter for attention to gastrostomy: Secondary | ICD-10-CM | POA: Diagnosis not present

## 2023-02-04 DIAGNOSIS — K9423 Gastrostomy malfunction: Secondary | ICD-10-CM | POA: Diagnosis not present

## 2023-02-24 DIAGNOSIS — W44F3XA Food entering into or through a natural orifice, initial encounter: Secondary | ICD-10-CM | POA: Diagnosis not present

## 2023-02-24 DIAGNOSIS — M3320 Polymyositis, organ involvement unspecified: Secondary | ICD-10-CM | POA: Diagnosis not present

## 2023-02-24 DIAGNOSIS — R1310 Dysphagia, unspecified: Secondary | ICD-10-CM | POA: Diagnosis not present

## 2023-02-24 DIAGNOSIS — R627 Adult failure to thrive: Secondary | ICD-10-CM | POA: Diagnosis not present

## 2023-02-24 DIAGNOSIS — R399 Unspecified symptoms and signs involving the genitourinary system: Secondary | ICD-10-CM | POA: Diagnosis not present

## 2023-02-24 DIAGNOSIS — Z931 Gastrostomy status: Secondary | ICD-10-CM | POA: Diagnosis not present

## 2023-02-24 DIAGNOSIS — R197 Diarrhea, unspecified: Secondary | ICD-10-CM | POA: Diagnosis not present

## 2023-03-01 ENCOUNTER — Telehealth: Payer: Self-pay | Admitting: Gastroenterology

## 2023-03-01 NOTE — Telephone Encounter (Signed)
I spoke with the pt daughter and she states that she needs an order for [facility] to replace feeding tube every 6 months as a standing order.  The daughter states that she usually has Dr Veatrice Kells take care of this but she was not sure if Dr Chales Abrahams can accommodate.  I will send to Dr Chales Abrahams to review

## 2023-03-01 NOTE — Telephone Encounter (Signed)
Inbound call from patient daughter requesting a order for [facility] for patient to have feeding tube removed in 6 months. Please advise.   Thank you

## 2023-03-04 DIAGNOSIS — C44622 Squamous cell carcinoma of skin of right upper limb, including shoulder: Secondary | ICD-10-CM | POA: Diagnosis not present

## 2023-03-08 NOTE — Telephone Encounter (Signed)
[name] see message from Dr Chales Abrahams

## 2023-03-08 NOTE — Telephone Encounter (Signed)
Please order to change feeding tube every 6 months via IR  not sure if [facility] will take orders from [place].  Worth a try RG

## 2023-03-09 NOTE — Telephone Encounter (Signed)
Left message for [facility] IR office to call back. Requesting fax number to sent order to.

## 2023-03-11 NOTE — Telephone Encounter (Signed)
Inbound call from [facility] Radiology states more information is needed. States patient's most recent physical, medication and allergy list, and size of gastric tube needs to be faxed to [phone]. Please advise, thank you.

## 2023-03-11 NOTE — Telephone Encounter (Signed)
Fax was sent back with the information requested.

## 2023-03-11 NOTE — Telephone Encounter (Signed)
Fax Number was received. Order faxed to [phone]: Pt daughter Marylene Land made aware. Marylene Land  verbalized understanding with all questions answered.

## 2023-03-27 DIAGNOSIS — Z23 Encounter for immunization: Secondary | ICD-10-CM | POA: Diagnosis not present

## 2023-04-05 DIAGNOSIS — M25552 Pain in left hip: Secondary | ICD-10-CM | POA: Diagnosis not present

## 2023-04-12 DIAGNOSIS — M3322 Polymyositis with myopathy: Secondary | ICD-10-CM | POA: Diagnosis not present

## 2023-05-03 DIAGNOSIS — R296 Repeated falls: Secondary | ICD-10-CM | POA: Diagnosis not present

## 2023-05-03 DIAGNOSIS — M3320 Polymyositis, organ involvement unspecified: Secondary | ICD-10-CM | POA: Diagnosis not present

## 2023-05-03 DIAGNOSIS — M51369 Other intervertebral disc degeneration, lumbar region without mention of lumbar back pain or lower extremity pain: Secondary | ICD-10-CM | POA: Diagnosis not present

## 2023-06-24 DIAGNOSIS — M25612 Stiffness of left shoulder, not elsewhere classified: Secondary | ICD-10-CM | POA: Diagnosis not present

## 2023-06-24 DIAGNOSIS — M6281 Muscle weakness (generalized): Secondary | ICD-10-CM | POA: Diagnosis not present

## 2023-06-24 DIAGNOSIS — R296 Repeated falls: Secondary | ICD-10-CM | POA: Diagnosis not present

## 2023-06-24 DIAGNOSIS — M25611 Stiffness of right shoulder, not elsewhere classified: Secondary | ICD-10-CM | POA: Diagnosis not present

## 2023-06-24 DIAGNOSIS — R2689 Other abnormalities of gait and mobility: Secondary | ICD-10-CM | POA: Diagnosis not present

## 2023-06-28 DIAGNOSIS — M6281 Muscle weakness (generalized): Secondary | ICD-10-CM | POA: Diagnosis not present

## 2023-06-28 DIAGNOSIS — M25611 Stiffness of right shoulder, not elsewhere classified: Secondary | ICD-10-CM | POA: Diagnosis not present

## 2023-06-28 DIAGNOSIS — R296 Repeated falls: Secondary | ICD-10-CM | POA: Diagnosis not present

## 2023-06-28 DIAGNOSIS — R2689 Other abnormalities of gait and mobility: Secondary | ICD-10-CM | POA: Diagnosis not present

## 2023-06-28 DIAGNOSIS — M25612 Stiffness of left shoulder, not elsewhere classified: Secondary | ICD-10-CM | POA: Diagnosis not present

## 2023-06-30 DIAGNOSIS — R1319 Other dysphagia: Secondary | ICD-10-CM | POA: Diagnosis not present

## 2023-06-30 DIAGNOSIS — G301 Alzheimer's disease with late onset: Secondary | ICD-10-CM | POA: Diagnosis not present

## 2023-06-30 DIAGNOSIS — E43 Unspecified severe protein-calorie malnutrition: Secondary | ICD-10-CM | POA: Diagnosis not present

## 2023-06-30 DIAGNOSIS — E890 Postprocedural hypothyroidism: Secondary | ICD-10-CM | POA: Diagnosis not present

## 2023-06-30 DIAGNOSIS — F0280 Dementia in other diseases classified elsewhere without behavioral disturbance: Secondary | ICD-10-CM | POA: Diagnosis not present

## 2023-06-30 DIAGNOSIS — I10 Essential (primary) hypertension: Secondary | ICD-10-CM | POA: Diagnosis not present

## 2023-06-30 DIAGNOSIS — E785 Hyperlipidemia, unspecified: Secondary | ICD-10-CM | POA: Diagnosis not present

## 2023-06-30 DIAGNOSIS — M3320 Polymyositis, organ involvement unspecified: Secondary | ICD-10-CM | POA: Diagnosis not present

## 2023-07-01 DIAGNOSIS — M25612 Stiffness of left shoulder, not elsewhere classified: Secondary | ICD-10-CM | POA: Diagnosis not present

## 2023-07-01 DIAGNOSIS — M6281 Muscle weakness (generalized): Secondary | ICD-10-CM | POA: Diagnosis not present

## 2023-07-01 DIAGNOSIS — R2689 Other abnormalities of gait and mobility: Secondary | ICD-10-CM | POA: Diagnosis not present

## 2023-07-01 DIAGNOSIS — M25611 Stiffness of right shoulder, not elsewhere classified: Secondary | ICD-10-CM | POA: Diagnosis not present

## 2023-07-01 DIAGNOSIS — R296 Repeated falls: Secondary | ICD-10-CM | POA: Diagnosis not present

## 2023-07-02 DIAGNOSIS — B079 Viral wart, unspecified: Secondary | ICD-10-CM | POA: Diagnosis not present

## 2023-07-02 DIAGNOSIS — D225 Melanocytic nevi of trunk: Secondary | ICD-10-CM | POA: Diagnosis not present

## 2023-07-02 DIAGNOSIS — L570 Actinic keratosis: Secondary | ICD-10-CM | POA: Diagnosis not present

## 2023-07-02 DIAGNOSIS — C44622 Squamous cell carcinoma of skin of right upper limb, including shoulder: Secondary | ICD-10-CM | POA: Diagnosis not present

## 2023-07-05 DIAGNOSIS — R296 Repeated falls: Secondary | ICD-10-CM | POA: Diagnosis not present

## 2023-07-05 DIAGNOSIS — M25611 Stiffness of right shoulder, not elsewhere classified: Secondary | ICD-10-CM | POA: Diagnosis not present

## 2023-07-05 DIAGNOSIS — R2689 Other abnormalities of gait and mobility: Secondary | ICD-10-CM | POA: Diagnosis not present

## 2023-07-05 DIAGNOSIS — M6281 Muscle weakness (generalized): Secondary | ICD-10-CM | POA: Diagnosis not present

## 2023-07-05 DIAGNOSIS — M25612 Stiffness of left shoulder, not elsewhere classified: Secondary | ICD-10-CM | POA: Diagnosis not present

## 2023-07-08 DIAGNOSIS — R296 Repeated falls: Secondary | ICD-10-CM | POA: Diagnosis not present

## 2023-07-08 DIAGNOSIS — M25612 Stiffness of left shoulder, not elsewhere classified: Secondary | ICD-10-CM | POA: Diagnosis not present

## 2023-07-08 DIAGNOSIS — M6281 Muscle weakness (generalized): Secondary | ICD-10-CM | POA: Diagnosis not present

## 2023-07-08 DIAGNOSIS — R2689 Other abnormalities of gait and mobility: Secondary | ICD-10-CM | POA: Diagnosis not present

## 2023-07-08 DIAGNOSIS — M25611 Stiffness of right shoulder, not elsewhere classified: Secondary | ICD-10-CM | POA: Diagnosis not present

## 2023-07-11 DIAGNOSIS — M25611 Stiffness of right shoulder, not elsewhere classified: Secondary | ICD-10-CM | POA: Diagnosis not present

## 2023-07-11 DIAGNOSIS — R2681 Unsteadiness on feet: Secondary | ICD-10-CM | POA: Diagnosis not present

## 2023-07-11 DIAGNOSIS — M6281 Muscle weakness (generalized): Secondary | ICD-10-CM | POA: Diagnosis not present

## 2023-07-11 DIAGNOSIS — M25612 Stiffness of left shoulder, not elsewhere classified: Secondary | ICD-10-CM | POA: Diagnosis not present

## 2023-07-11 DIAGNOSIS — R296 Repeated falls: Secondary | ICD-10-CM | POA: Diagnosis not present

## 2023-07-11 DIAGNOSIS — R293 Abnormal posture: Secondary | ICD-10-CM | POA: Diagnosis not present

## 2023-07-15 DIAGNOSIS — R296 Repeated falls: Secondary | ICD-10-CM | POA: Diagnosis not present

## 2023-07-15 DIAGNOSIS — R2681 Unsteadiness on feet: Secondary | ICD-10-CM | POA: Diagnosis not present

## 2023-07-15 DIAGNOSIS — M25612 Stiffness of left shoulder, not elsewhere classified: Secondary | ICD-10-CM | POA: Diagnosis not present

## 2023-07-15 DIAGNOSIS — R293 Abnormal posture: Secondary | ICD-10-CM | POA: Diagnosis not present

## 2023-07-15 DIAGNOSIS — M25611 Stiffness of right shoulder, not elsewhere classified: Secondary | ICD-10-CM | POA: Diagnosis not present

## 2023-07-15 DIAGNOSIS — M6281 Muscle weakness (generalized): Secondary | ICD-10-CM | POA: Diagnosis not present

## 2023-07-18 DIAGNOSIS — R296 Repeated falls: Secondary | ICD-10-CM | POA: Diagnosis not present

## 2023-07-18 DIAGNOSIS — M25611 Stiffness of right shoulder, not elsewhere classified: Secondary | ICD-10-CM | POA: Diagnosis not present

## 2023-07-18 DIAGNOSIS — R2689 Other abnormalities of gait and mobility: Secondary | ICD-10-CM | POA: Diagnosis not present

## 2023-07-18 DIAGNOSIS — M25612 Stiffness of left shoulder, not elsewhere classified: Secondary | ICD-10-CM | POA: Diagnosis not present

## 2023-07-18 DIAGNOSIS — M6281 Muscle weakness (generalized): Secondary | ICD-10-CM | POA: Diagnosis not present

## 2023-07-18 DIAGNOSIS — Z431 Encounter for attention to gastrostomy: Secondary | ICD-10-CM | POA: Diagnosis not present

## 2023-07-21 DIAGNOSIS — M6281 Muscle weakness (generalized): Secondary | ICD-10-CM | POA: Diagnosis not present

## 2023-07-21 DIAGNOSIS — M25611 Stiffness of right shoulder, not elsewhere classified: Secondary | ICD-10-CM | POA: Diagnosis not present

## 2023-07-21 DIAGNOSIS — M25612 Stiffness of left shoulder, not elsewhere classified: Secondary | ICD-10-CM | POA: Diagnosis not present

## 2023-07-21 DIAGNOSIS — R296 Repeated falls: Secondary | ICD-10-CM | POA: Diagnosis not present

## 2023-07-21 DIAGNOSIS — R2681 Unsteadiness on feet: Secondary | ICD-10-CM | POA: Diagnosis not present

## 2023-07-21 DIAGNOSIS — R293 Abnormal posture: Secondary | ICD-10-CM | POA: Diagnosis not present

## 2023-07-25 DIAGNOSIS — R293 Abnormal posture: Secondary | ICD-10-CM | POA: Diagnosis not present

## 2023-07-25 DIAGNOSIS — R2681 Unsteadiness on feet: Secondary | ICD-10-CM | POA: Diagnosis not present

## 2023-07-25 DIAGNOSIS — M6281 Muscle weakness (generalized): Secondary | ICD-10-CM | POA: Diagnosis not present

## 2023-07-25 DIAGNOSIS — M25611 Stiffness of right shoulder, not elsewhere classified: Secondary | ICD-10-CM | POA: Diagnosis not present

## 2023-07-25 DIAGNOSIS — M25612 Stiffness of left shoulder, not elsewhere classified: Secondary | ICD-10-CM | POA: Diagnosis not present

## 2023-07-25 DIAGNOSIS — R296 Repeated falls: Secondary | ICD-10-CM | POA: Diagnosis not present

## 2023-08-01 DIAGNOSIS — Z79899 Other long term (current) drug therapy: Secondary | ICD-10-CM | POA: Diagnosis not present

## 2023-08-01 DIAGNOSIS — R1310 Dysphagia, unspecified: Secondary | ICD-10-CM | POA: Diagnosis not present

## 2023-08-01 DIAGNOSIS — I7300 Raynaud's syndrome without gangrene: Secondary | ICD-10-CM | POA: Diagnosis not present

## 2023-08-01 DIAGNOSIS — Z681 Body mass index (BMI) 19 or less, adult: Secondary | ICD-10-CM | POA: Diagnosis not present

## 2023-08-01 DIAGNOSIS — H04123 Dry eye syndrome of bilateral lacrimal glands: Secondary | ICD-10-CM | POA: Diagnosis not present

## 2023-08-01 DIAGNOSIS — M1991 Primary osteoarthritis, unspecified site: Secondary | ICD-10-CM | POA: Diagnosis not present

## 2023-08-01 DIAGNOSIS — M5136 Other intervertebral disc degeneration, lumbar region with discogenic back pain only: Secondary | ICD-10-CM | POA: Diagnosis not present

## 2023-08-01 DIAGNOSIS — R296 Repeated falls: Secondary | ICD-10-CM | POA: Diagnosis not present

## 2023-08-01 DIAGNOSIS — M3322 Polymyositis with myopathy: Secondary | ICD-10-CM | POA: Diagnosis not present

## 2023-08-01 DIAGNOSIS — R399 Unspecified symptoms and signs involving the genitourinary system: Secondary | ICD-10-CM | POA: Diagnosis not present

## 2023-08-30 ENCOUNTER — Encounter: Payer: Self-pay | Admitting: Gastroenterology

## 2023-09-07 ENCOUNTER — Emergency Department (HOSPITAL_COMMUNITY)
Admission: EM | Admit: 2023-09-07 | Discharge: 2023-09-07 | Disposition: A | Discharge status: Home / Self Care | Attending: Emergency Medicine | Admitting: Emergency Medicine

## 2023-09-07 ENCOUNTER — Other Ambulatory Visit: Payer: Self-pay

## 2023-09-07 ENCOUNTER — Emergency Department (HOSPITAL_COMMUNITY)

## 2023-09-07 ENCOUNTER — Encounter (HOSPITAL_COMMUNITY): Payer: Self-pay

## 2023-09-07 DIAGNOSIS — K9423 Gastrostomy malfunction: Secondary | ICD-10-CM | POA: Diagnosis not present

## 2023-09-07 DIAGNOSIS — Z4682 Encounter for fitting and adjustment of non-vascular catheter: Secondary | ICD-10-CM | POA: Diagnosis not present

## 2023-09-07 MED ORDER — IOHEXOL 300 MG/ML  SOLN
50.0000 mL | Freq: Once | INTRAMUSCULAR | Status: AC | PRN
  Administered 2023-09-07: 30 mL

## 2023-09-07 NOTE — Discharge Instructions (Addendum)
 1.  Use your gastrostomy tube as you have previously. 2.  Return if you have any concerning problems or changes. 3.  Follow-up with your doctor as needed for recheck.

## 2023-09-07 NOTE — ED Notes (Signed)
 Called IR to get g-tubes

## 2023-09-07 NOTE — ED Provider Notes (Signed)
 [place] EMERGENCY DEPARTMENT AT [facility] Provider Note   CSN: [id number] Arrival date & time: 09/07/23  0740     History  Chief Complaint  Patient presents with   g tube    [name] is a 78 y.o. female.  HPI Patient has had a feeding tube for about 2 years.  She has had intermittent episodes of tube falling out or being dislodged.  Patient's daughter and caregivers are very familiar with managing the tube for her.  Yesterday they were able to replace a tube around 2 PM.  The tube again became dislodged at about 5 PM.  At that time patient's daughter was able to place a 20 [place] Foley catheter for preservation of ostomy site.  Family members had been trained in managing this.  Patient's daughter tried to get appointment today with interventional radiology at [facility] for tube replacement.  They are advised there will be no availability for replacement for several weeks.  Patient daughter was able to confirm that her prior gastrostomy tube had been a 15 [place] placed at [facility]  Family members placed a 20 [place] Foley catheter yesterday which has maintained the tract.    Home Medications Prior to Admission medications   Medication Sig Start Date End Date Taking? Authorizing Provider  Biotin 78295 MCG TABS Take 10,000 mcg by mouth daily. Patient not taking: Reported on 11/11/2022    [provider]  cholecalciferol (VITAMIN D) 25 MCG (1000 UNIT) tablet Take 1,000 Units by mouth daily. Patient not taking: Reported on 11/11/2022    [provider]  donepezil (ARICEPT) 10 MG tablet Take 10 mg by mouth at bedtime. 01/15/22   [provider]  folic acid (FOLVITE) 1 MG tablet Take 1 mg by mouth daily. 07/09/21   [provider]  ipratropium (ATROVENT) 0.06 % nasal spray 2 sprays 2 (two) times a day for 30 days. 03/18/22   [provider]  levothyroxine (SYNTHROID) 125 MCG tablet Take 125 mcg by mouth daily.  01/22/22   [provider]  methotrexate 2.5 MG tablet Take 10 mg by mouth once a week. Caution:Chemotherapy. Protect from light.     [provider]  mirtazapine (REMERON) 7.5 MG tablet Take 7.5 mg by mouth at bedtime. 08/02/21   [provider]  Multiple Vitamin (MULTIVITAMINS PO) Take 1 tablet by mouth daily. Patient not taking: Reported on 11/11/2022    [provider]  omeprazole (PRILOSEC) 20 MG capsule Take 1 capsule (20 mg total) by mouth daily. 11/25/21   Lynann Bologna, MD      Allergies    Patient has no known allergies.    Review of Systems   Review of Systems  Physical Exam Updated Vital Signs BP 137/61 (BP Location: Left Arm)   Pulse 85   Temp 98.5 F (36.9 C) (Oral)   Resp 16   Ht 5\' 4"  (1.626 m)   Wt 46.6 kg   SpO2 99%   BMI 17.63 kg/m  Physical Exam Constitutional:      Comments: Patient is alert nontoxic.  Well-groomed.  No respiratory distress.  HENT:     Mouth/Throat:     Pharynx: Oropharynx is clear.  Eyes:     Extraocular Movements: Extraocular movements intact.  Cardiovascular:     Rate and Rhythm: Normal rate and regular rhythm.  Pulmonary:     Effort: Pulmonary effort is normal.     Breath sounds: Normal breath sounds.  Abdominal:  Comments: Abdomen is soft nontender.  Patient has a well-established gastrostomy site in the mid abdomen with a Foley catheter currently in place.  Musculoskeletal:        General: Normal range of motion.  Skin:    General: Skin is warm and dry.  Neurological:     General: No focal deficit present.     ED Results / Procedures / Treatments   Labs (all labs ordered are listed, but only abnormal results are displayed) Labs Reviewed - No data to display  EKG None  Radiology No results found.  Procedures .Gastrostomy tube replacement  Date/Time: 09/07/2023 10:16 AM  Performed by: Arby Barrette, MD Authorized by: Arby Barrette, MD  Consent: Verbal consent  obtained. Consent given by: patient Patient identity confirmed: verbally with patient Preparation: Patient was prepped and draped in the usual sterile fashion. Local anesthesia used: no  Anesthesia: Local anesthesia used: no  Sedation: Patient sedated: no  Patient tolerance: patient tolerated the procedure well with no immediate complications Comments: 20 French Foley catheter removed from gastrostomy site.  Removed without difficulty.  A 20 French gastrostomy tube with bolster placed without difficulty.  Area prepped with ChloraPrep.  Surgilube used and passed tube without difficulty.  Inflated to 10 mL.  Balloon had previously been tested and intact.  Patient tolerated well.       Medications Ordered in ED Medications  iohexol (OMNIPAQUE) 300 MG/ML solution 50 mL (30 mLs Per Tube Contrast Given 09/07/23 1058)    ED Course/ Medical Decision Making/ A&P                                 Medical Decision Making Amount and/or Complexity of Data Reviewed Radiology: ordered.  Risk Prescription drug management.   Patient has a well-established gastrostomy tube site.  She has periodically had it feel or get pulled out.  This time the balloon seems to have broken and family members have instruction and training on placing Foley catheter to maintain ostomy tract.  Patient's daughter tried to maintain a Foley catheter twice yesterday and yet IR follow-up expeditiously to replace the Foley catheter with a feeding tube.  She was unable to obtain needed services and presented to the emergency department.  Patient is well in appearance.  She does have a 16 French Foley catheter in place maintaining the ostomy.  Review of EMR indicates that patient has had a 20 [place] feeding tube placed in our healthcare system, as well as by family report a 66 [place] placed at [facility] 2\10\2025.  Patient well-established tract.  I was able to place a 20 Jamaica feeding tube without difficulty.  Placement  confirmed by plain film x-ray.  Patient has tolerated well.  She has no pain, no bleeding.  She has been up and ambulatory.  Stable for discharge.  Family member requested a replacement 16 French coud Foley catheter in the event there should be failure of gastrostomy or get pulled out, such that they can maintain patient's ostomy tract.  They have training for replacing this and have done so successfully.        Final Clinical Impression(s) / ED Diagnoses Final diagnoses:  Gastrostomy tube dysfunction [facility])    Rx / DC Orders ED Discharge Orders     None         Arby Barrette, MD 09/07/23 1125

## 2023-09-07 NOTE — ED Triage Notes (Signed)
 Pt arrived reporting Gtube fell out. Catheter put in place. Slight soreness reported around site. No other symptoms

## 2023-09-16 ENCOUNTER — Other Ambulatory Visit: Payer: Self-pay

## 2023-09-16 DIAGNOSIS — R1319 Other dysphagia: Secondary | ICD-10-CM | POA: Insufficient documentation

## 2023-09-19 ENCOUNTER — Encounter: Payer: Self-pay | Admitting: Cardiology

## 2023-09-19 ENCOUNTER — Ambulatory Visit: Attending: Cardiology | Admitting: Cardiology

## 2023-09-19 VITALS — BP 130/78 | HR 91 | Ht 62.0 in | Wt 104.4 lb

## 2023-09-19 DIAGNOSIS — I4729 Other ventricular tachycardia: Secondary | ICD-10-CM | POA: Insufficient documentation

## 2023-09-19 DIAGNOSIS — I10 Essential (primary) hypertension: Secondary | ICD-10-CM | POA: Insufficient documentation

## 2023-09-19 NOTE — Progress Notes (Addendum)
 Cardiology Office Note:    Date:  09/19/2023   ID:  [name], DOB 1945/08/15, MRN [id number]  PCP:  Valerie Fusi, MD  Cardiologist:  Valerie Brothers, MD   Referring MD: Valerie Fusi, MD    ASSESSMENT:    1. Hypertension, essential, benign   2. Nonsustained ventricular tachycardia (HCC)    PLAN:    In order of problems listed above:  Primary prevention stressed with the patient.  Importance of compliance with diet medication stressed and patient verbalized standing Nonsustained ventricular tachycardia: Stable asymptomatic.  Preserved ejection fraction.  Medical management.  Routine blood work followed by primary care. She was advised to ambulate the best of her ability.  I will see her in follow-up in 1 year.  Questions were answered to their satisfaction.   Medication Adjustments/Labs and Tests Ordered: Current medicines are reviewed at length with the patient today.  Concerns regarding medicines are outlined above.  Orders Placed This Encounter  Procedures   EKG 12-Lead   No orders of the defined types were placed in this encounter.    No chief complaint on file.    History of Present Illness:    [name] is a 78 y.o. female.  Patient has past medical history of nonsustained ventricular tachycardia.  She has a feeding tube because of polymyositis.  She ambulates very little.  She denies any chest pain orthopnea or PND.  Her daughter is a Engineer, civil (consulting) in the emergency room and accompanies her for this visit.  She is asymptomatic.  At the time of my evaluation, the patient is alert awake oriented and in no distress.  Past Medical History:  Diagnosis Date   Abnormal EKG 08/05/2021   Adnexal mass 03/17/2020   Formatting of this note might be different from the original. Added automatically from request for surgery [id number]   Adult failure to thrive 11/11/2022   Anal fissure    Arthritis    Atrophic vaginitis    BPPV (benign paroxysmal positional  vertigo)    Cardiac murmur 08/05/2021   Chronic sinusitis    DDD (degenerative disc disease), lumbar    Diarrhea    Dysphagia 08/04/2021   Esophageal dysphagia    Heart block    Hemorrhoids    Hiatal hernia with GERD    Hyperlipidemia LDL goal <100    Hypertension, essential, benign    Memory loss    Mobitz II    Osteoarthritis    Overactive bladder    Polymyositis (HCC)    Postsurgical hypothyroidism    Raynaud's phenomenon    Sensorineural hearing loss (SNHL), bilateral    Tremor    Urinary incontinence    Vertigo     Past Surgical History:  Procedure Laterality Date   BILATERAL OOPHORECTOMY     CARPAL TUNNEL RELEASE     CATARACT EXTRACTION, BILATERAL     COLONOSCOPY  01/04/2012   Dr Jennye Boroughs. Diminutive rectal polyp (1). No source of signicifant GI blood loss identified   ESOPHAGOGASTRODUODENOSCOPY  10/11/2019   Dr Jennye Boroughs. Diminutive antral ulcers (2) Hiatal hernia. Empiric esophageal dilation to 50 Jamaica   HERNIA REPAIR  06/2021   IR GASTROSTOMY TUBE MOD SED  12/03/2021   IR REPLACE G-TUBE SIMPLE WO FLUORO  01/02/2022   IR REPLC GASTRO/COLONIC TUBE PERCUT W/FLUORO  03/20/2022   THYROIDECTOMY     TOTAL KNEE ARTHROPLASTY Right    TUBAL LIGATION      Current Medications: Current Meds  Medication Sig  donepezil (ARICEPT) 10 MG tablet Take 10 mg by mouth at bedtime.   folic acid (FOLVITE) 1 MG tablet Take 1 mg by mouth daily.   levothyroxine (SYNTHROID) 125 MCG tablet Take 125 mcg by mouth daily.   memantine (NAMENDA) 10 MG tablet Take 10 mg by mouth daily.   methotrexate 2.5 MG tablet Take 10 mg by mouth once a week. Caution:Chemotherapy. Protect from light.    mirtazapine (REMERON) 7.5 MG tablet Take 7.5 mg by mouth at bedtime.   omeprazole (PRILOSEC) 20 MG capsule Take 1 capsule (20 mg total) by mouth daily.     Allergies:   Patient has no known allergies.   Social History   Socioeconomic History   Marital status: Married    Spouse name: Not on  file   Number of children: 3   Years of education: Not on file   Highest education level: Not on file  Occupational History   Occupation: Retired  Tobacco Use   Smoking status: Never   Smokeless tobacco: Never  Vaping Use   Vaping status: Never Used  Substance and Sexual Activity   Alcohol use: Never   Drug use: Never   Sexual activity: Not on file  Other Topics Concern   Not on file  Social History Narrative   Not on file   Social Drivers of Health   Financial Resource Strain: Not on file  Food Insecurity: Not on file  Transportation Needs: Not on file  Physical Activity: Not on file  Stress: Not on file  Social Connections: Unknown (02/04/2023)   [facility]   Social Network    Social Network: Not on file     Family History: The patient's family history includes Diabetes in her mother; Heart disease in her mother. There is no history of Colon cancer, Stomach cancer, Esophageal cancer, or Colon polyps.  ROS:   Please see the history of present illness.    All other systems reviewed and are negative.  EKGs/Labs/Other Studies Reviewed:    The following studies were reviewed today: I discussed my findings with the patient at length  .Marland KitchenEKG Interpretation Date/Time:  Monday September 19 2023 14:19:22 EDT Ventricular Rate:  91 PR Interval:  138 QRS Duration:  66 QT Interval:  344 QTC Calculation: 423 R Axis:   53  Text Interpretation: Sinus rhythm with Premature supraventricular complexes Nonspecific ST abnormality Abnormal ECG No previous ECGs available Confirmed by [name] [phone]) on 09/19/2023 2:38:34 PM     Recent Labs: No results found for requested labs within last 365 days.  Recent Lipid Panel No results found for: "CHOL", "TRIG", "HDL", "CHOLHDL", "VLDL", "LDLCALC", "LDLDIRECT"  Physical Exam:    VS:  BP 130/78   Pulse 91   Ht 5\' 2"  (1.575 m)   Wt 104 lb 6.4 oz (47.4 kg)   SpO2 97%   BMI 19.10 kg/m     Wt Readings from Last  3 Encounters:  09/19/23 104 lb 6.4 oz (47.4 kg)  09/07/23 102 lb 11.8 oz (46.6 kg)  11/11/22 102 lb 12.8 oz (46.6 kg)     GEN: Patient is in no acute distress HEENT: Normal NECK: No JVD; No carotid bruits LYMPHATICS: No lymphadenopathy CARDIAC: Hear sounds regular, 2/6 systolic murmur at the apex. RESPIRATORY:  Clear to auscultation without rales, wheezing or rhonchi  ABDOMEN: Soft, non-tender, non-distended MUSCULOSKELETAL:  No edema; No deformity  SKIN: Warm and dry NEUROLOGIC:  Alert and oriented x 3 PSYCHIATRIC:  Normal affect  Signed, Nelia Balzarine, MD  09/19/2023 2:36 PM    [facility]

## 2023-09-19 NOTE — Patient Instructions (Signed)
 Medication Instructions:  Your physician recommends that you continue on your current medications as directed. Please refer to the Current Medication list given to you today.  *If you need a refill on your cardiac medications before your next appointment, please call your pharmacy*   Lab Work: None Ordered If you have labs (blood work) drawn today and your tests are completely normal, you will receive your results only by: MyChart Message (if you have MyChart) OR A paper copy in the mail If you have any lab test that is abnormal or we need to change your treatment, we will call you to review the results.   Testing/Procedures: None Ordered   Follow-Up: At [facility], you and your health needs are our priority.  As part of our continuing mission to provide you with exceptional heart care, we have created designated Provider Care Teams.  These Care Teams include your primary Cardiologist (physician) and Advanced Practice Providers (APPs -  Physician Assistants and Nurse Practitioners) who all work together to provide you with the care you need, when you need it.  We recommend signing up for the patient portal called "MyChart".  Sign up information is provided on this After Visit Summary.  MyChart is used to connect with patients for Virtual Visits (Telemedicine).  Patients are able to view lab/test results, encounter notes, upcoming appointments, etc.  Non-urgent messages can be sent to your provider as well.   To learn more about what you can do with MyChart, go to [email or web address].    Your next appointment:   12 month follow up

## 2023-09-26 ENCOUNTER — Other Ambulatory Visit: Payer: Self-pay

## 2023-09-26 ENCOUNTER — Emergency Department (HOSPITAL_COMMUNITY)

## 2023-09-26 ENCOUNTER — Emergency Department (HOSPITAL_COMMUNITY)
Admission: EM | Admit: 2023-09-26 | Discharge: 2023-09-26 | Disposition: A | Discharge status: Home / Self Care | Attending: Emergency Medicine | Admitting: Emergency Medicine

## 2023-09-26 ENCOUNTER — Encounter (HOSPITAL_COMMUNITY): Payer: Self-pay | Admitting: Emergency Medicine

## 2023-09-26 ENCOUNTER — Telehealth: Payer: Self-pay

## 2023-09-26 DIAGNOSIS — Z4682 Encounter for fitting and adjustment of non-vascular catheter: Secondary | ICD-10-CM | POA: Diagnosis not present

## 2023-09-26 DIAGNOSIS — R6889 Other general symptoms and signs: Secondary | ICD-10-CM

## 2023-09-26 DIAGNOSIS — K9423 Gastrostomy malfunction: Secondary | ICD-10-CM | POA: Diagnosis not present

## 2023-09-26 DIAGNOSIS — Z79899 Other long term (current) drug therapy: Secondary | ICD-10-CM | POA: Insufficient documentation

## 2023-09-26 DIAGNOSIS — I10 Essential (primary) hypertension: Secondary | ICD-10-CM | POA: Diagnosis not present

## 2023-09-26 DIAGNOSIS — Z931 Gastrostomy status: Secondary | ICD-10-CM | POA: Diagnosis not present

## 2023-09-26 MED ORDER — IOHEXOL 300 MG/ML  SOLN
30.0000 mL | Freq: Once | INTRAMUSCULAR | Status: AC | PRN
  Administered 2023-09-26: 30 mL

## 2023-09-26 NOTE — Discharge Instructions (Signed)
 He was seen today for gastric tube leakage.  You had a 7 Jamaica originally in place and had it replaced with a 16 Jamaica.  Recommend that you continue to follow-up with gastroenterology.  I have attached [facility] as well as [facility]'s information which you can call both offices to see who might be able to see her in the near future.  If he continues to have persistent leakage despite not having any seen currently, return to the ED as she may need to have another replacement with a bigger tube and or to ensure no signs of infection are present.  Also she is experiencing the source of her fever, increased belly pain, persistent leakage, return to the ED for evaluation.

## 2023-09-26 NOTE — ED Provider Notes (Signed)
 [name] Provider Note   CSN: [id number] Arrival date & time: 09/26/23  1041     History  Chief Complaint  Patient presents with   Feeding Tube Problem    [name] is a 78 y.o. female.  HPI Patient is a 78 year old female accompanied by her daughter here to the ED today complaining of leakage from the feeding tube with surrounding irritation starting today, contacted [name] and was told to come to the ED.  Noted to be previously seen in the ED on 09/07/2023 where she was noted to have had leakage from her G-tube.  Had a 20 [place] Foley catheter placed in the ostomy at that time.   Denies fever, n/v/d, abdominal pain, dysuria, hematuria, melena, hematochezia.      Home Medications Prior to Admission medications   Medication Sig Start Date End Date Taking? Authorizing Provider  donepezil (ARICEPT) 10 MG tablet Take 10 mg by mouth at bedtime. 01/15/22   [provider]  folic acid (FOLVITE) 1 MG tablet Take 1 mg by mouth daily. 07/09/21   [provider]  levothyroxine (SYNTHROID) 125 MCG tablet Take 125 mcg by mouth daily. 01/22/22   [provider]  memantine (NAMENDA) 10 MG tablet Take 10 mg by mouth daily. 06/25/23   [provider]  methotrexate 2.5 MG tablet Take 10 mg by mouth once a week. Caution:Chemotherapy. Protect from light.     [provider]  mirtazapine (REMERON) 7.5 MG tablet Take 7.5 mg by mouth at bedtime. 08/02/21   [provider]  omeprazole  (PRILOSEC) 20 MG capsule Take 1 capsule (20 mg total) by mouth daily. 11/25/21   Valerie Pila, MD      Allergies    Patient has no known allergies.    Review of Systems   Review of Systems  Gastrointestinal:        Leakage from gastric tube  All other systems reviewed and are negative.   Physical Exam Updated Vital Signs BP 134/71   Pulse 77   Temp 97.6 F (36.4 C) (Oral)   Resp 16   SpO2 99%  Physical  Exam Vitals and nursing note reviewed.  Constitutional:      General: She is not in acute distress.    Appearance: Normal appearance. She is not ill-appearing.  HENT:     Head: Normocephalic and atraumatic.  Eyes:     General: No scleral icterus.       Right eye: No discharge.        Left eye: No discharge.     Extraocular Movements: Extraocular movements intact.     Conjunctiva/sclera: Conjunctivae normal.  Cardiovascular:     Rate and Rhythm: Normal rate and regular rhythm.     Pulses: Normal pulses.  Pulmonary:     Effort: Pulmonary effort is normal. No respiratory distress.     Breath sounds: Normal breath sounds. No wheezing.  Abdominal:     General: Abdomen is flat. There is no distension.     Palpations: Abdomen is soft.     Tenderness: There is no abdominal tenderness. There is no guarding.     Comments: Ostomy in place with mild skin irritation noted with no signs of cellulitis  Skin:    General: Skin is warm and dry.     Findings: No bruising or rash.  Neurological:     General: No focal deficit present.     Mental Status: She is alert.  Mental status is at baseline.     Motor: No weakness.     Gait: Gait normal.  Psychiatric:        Mood and Affect: Mood normal.     ED Results / Procedures / Treatments   Labs (all labs ordered are listed, but only abnormal results are displayed) Labs Reviewed - No data to display  EKG None  Radiology No results found.  Procedures Procedures    Medications Ordered in ED Medications  iohexol  (OMNIPAQUE ) 300 MG/ML solution 30 mL (30 mLs Per Tube Contrast Given 09/26/23 1559)    ED Course/ Medical Decision Making/ A&P                                 Medical Decision Making Amount and/or Complexity of Data Reviewed Radiology: ordered.  Risk Prescription drug management.   Patient was already actively being seen by Tama Fails, PA-C who removed previous G-tube and inserted a 16 French G-tube.  This patient  is a 78 year old female presenting with daughter who presents to the ED for concern of leakage from her PEG tube that was inserted on 09/07/2023 in the ED after previously patient noted was previously using a 87 Jamaica however had a 20 Jamaica placed at that time.  Noted to have had 3 separate episodes of leakage this week that have been intermittent.  Contact [name] today with concerns of leakage in mild irritation around the ostomy and was told to come to the ED today.  On physical exam, patient is in no acute distress, afebrile, alert, speaking in full sentences, nontachypneic, nontachycardic.  Mild epigastric tenderness noted to palpation with no other abdominal pain noted otherwise.  Mild scattered erythema noted around the ostomy with no signs of active infection or cellulitis, suspected skin contusion from leaking.   With tube already having been replaced at this time by outliers PA-C, x-ray was done to evaluate placement.  Imaging was assessed by both myself and attending who agreed that placement appeared to be appropriate we placed.  Patient is actively desiring to leave at this time.  Provided care instructions as well as provided [name] information for her to contact their offices as she was requesting a [name] doctor follow-up.  Patient vital signs have remained stable throughout the course of patient's time in the ED. Low suspicion for any other emergent pathology at this time. I believe this patient is safe to be discharged. Provided strict return to ER precautions. Patient expressed agreement and understanding of plan. All questions were answered..   Differential diagnoses prior to evaluation: The emergent differential diagnosis includes, but is not limited to, clogged G-tube, displacement, infected G-tube, leaking G-tube, cellulitis, perforation. This is not an exhaustive differential.   Past Medical History / Co-morbidities / Social History: Heart Block, HLD, HTN,  esophageal dysphagia heart block,  memory loss, urinary incontinence  Additional history: Chart reviewed. Pertinent results include: Seen by [name] on 07/15/2023 for a G-tube placement  Lab Tests/Imaging studies: I personally interpreted labs/imaging and the pertinent results include: DG abdomen PEG tube location.   I agree with the radiologist interpretation.    Medications: No other medications are needed at this time.  I have reviewed the patients home medicines and have made adjustments as needed.   Disposition: After consideration of the diagnostic results and the patients response to treatment, I feel that the patient would benefit from discharge and treatment as above.  emergency department workup does not suggest an emergent condition requiring admission or immediate intervention beyond what has been performed at this time. The plan is: Follow-up with [name] as needed, monitor for new or worsening symptoms, observe for leakage and if present return to the ED.. The patient is safe for discharge and has been instructed to return immediately for worsening symptoms, change in symptoms or any other concerns.   Final Clinical Impression(s) / ED Diagnoses Final diagnoses:  Complaint associated with gastric tube [facility])    Rx / DC Orders ED Discharge Orders     None

## 2023-09-26 NOTE — Telephone Encounter (Signed)
 Pt daughter Shelvy Dickens  called and stated that the pt feeding tube is leaking pretty bad now and was wondering if we could get her an appointment with IR. Daughter was notified with the time sensitive of this issue that it would be best for her to proceed to the ER to have the tube evaluated and treated.  Shelvy Dickens  verbalized understanding with all questions answered.

## 2023-09-26 NOTE — ED Provider Triage Note (Signed)
 Emergency Medicine Provider Triage Evaluation Note  [name] , a 78 y.o. female  was evaluated in triage.  Pt complains of  gtube dysfunction Changed in the ED .on 4/2- from 45fr to a 20. Now constantly leaking , causeing skin irritation. Unable to swllow due to polymyositis  Review of Systems  Positive: G tube dysfunction Negative: fever  Physical Exam  BP 137/71   Pulse 77   Temp 97.6 F (36.4 C) (Oral)   Resp 16   SpO2 92%  Gen:   Awake, no distress   Resp:  Normal effort  MSK:   Moves extremities without difficulty  Other:    Medical Decision Making  Medically screening exam initiated at 2:48 PM.  Appropriate orders placed.  [name] was informed that the remainder of the evaluation will be completed by another provider, this initial triage assessment does not replace that evaluation, and the importance of remaining in the ED until their evaluation is complete.     Tama Fails, PA-C 09/26/23 1450

## 2023-09-26 NOTE — ED Provider Notes (Signed)
.  Gastrostomy tube replacement  Date/Time: 09/26/2023 3:47 PM  Performed by: Tama Fails, PA-C Authorized by: Tama Fails, PA-C  Consent: Verbal consent obtained. Risks and benefits: risks, benefits and alternatives were discussed Consent given by: patient Patient identity confirmed: verbally with patient Time out: Immediately prior to procedure a "time out" was called to verify the correct patient, procedure, equipment, support staff and site/side marked as required. Preparation: Patient was prepped and draped in the usual sterile fashion. Local anesthesia used: no  Anesthesia: Local anesthesia used: no Patient tolerance: patient tolerated the procedure well with no immediate complications Comments: 20 Fr removed, 16 Fr catheter placed       Tama Fails, PA-C 09/26/23 1550    Sueellen Emery, MD 09/27/23 1530

## 2023-09-26 NOTE — ED Triage Notes (Signed)
 Pt reports leaking gastric contents around her feeding tube. Reports she had it replaced on the 2nd.

## 2023-10-07 DIAGNOSIS — B079 Viral wart, unspecified: Secondary | ICD-10-CM | POA: Diagnosis not present

## 2023-10-07 DIAGNOSIS — L570 Actinic keratosis: Secondary | ICD-10-CM | POA: Diagnosis not present

## 2023-10-07 DIAGNOSIS — D485 Neoplasm of uncertain behavior of skin: Secondary | ICD-10-CM | POA: Diagnosis not present

## 2023-10-07 DIAGNOSIS — L820 Inflamed seborrheic keratosis: Secondary | ICD-10-CM | POA: Diagnosis not present

## 2023-10-18 DIAGNOSIS — E890 Postprocedural hypothyroidism: Secondary | ICD-10-CM | POA: Diagnosis not present

## 2023-10-18 DIAGNOSIS — G301 Alzheimer's disease with late onset: Secondary | ICD-10-CM | POA: Diagnosis not present

## 2023-10-18 DIAGNOSIS — S31109A Unspecified open wound of abdominal wall, unspecified quadrant without penetration into peritoneal cavity, initial encounter: Secondary | ICD-10-CM | POA: Diagnosis not present

## 2023-10-18 DIAGNOSIS — K219 Gastro-esophageal reflux disease without esophagitis: Secondary | ICD-10-CM | POA: Diagnosis not present

## 2023-10-18 DIAGNOSIS — M3320 Polymyositis, organ involvement unspecified: Secondary | ICD-10-CM | POA: Diagnosis not present

## 2023-10-18 DIAGNOSIS — R399 Unspecified symptoms and signs involving the genitourinary system: Secondary | ICD-10-CM | POA: Diagnosis not present

## 2023-10-18 DIAGNOSIS — F0280 Dementia in other diseases classified elsewhere without behavioral disturbance: Secondary | ICD-10-CM | POA: Diagnosis not present

## 2023-10-21 DIAGNOSIS — C44629 Squamous cell carcinoma of skin of left upper limb, including shoulder: Secondary | ICD-10-CM | POA: Diagnosis not present

## 2023-12-15 DIAGNOSIS — F5104 Psychophysiologic insomnia: Secondary | ICD-10-CM | POA: Diagnosis not present

## 2023-12-15 DIAGNOSIS — G301 Alzheimer's disease with late onset: Secondary | ICD-10-CM | POA: Diagnosis not present

## 2023-12-15 DIAGNOSIS — L0202 Furuncle of face: Secondary | ICD-10-CM | POA: Diagnosis not present

## 2023-12-15 DIAGNOSIS — F0280 Dementia in other diseases classified elsewhere without behavioral disturbance: Secondary | ICD-10-CM | POA: Diagnosis not present

## 2023-12-27 ENCOUNTER — Telehealth: Payer: Self-pay

## 2023-12-27 DIAGNOSIS — Z931 Gastrostomy status: Secondary | ICD-10-CM

## 2023-12-27 DIAGNOSIS — K9423 Gastrostomy malfunction: Secondary | ICD-10-CM

## 2023-12-27 NOTE — Telephone Encounter (Signed)
 Please go ahead and do IR consultation for G tube change RG

## 2023-12-27 NOTE — Telephone Encounter (Signed)
 Pt daughter Jon called on behalf of pt stating that they are requesting an order to interventional radiology to change out pt 16 french feeding tube. Pt daughter stated that the recommendations when pt got tube placed was to change out feeding tube every 6 months.  Pt stated that there is no Interventional Radiology in [place] now and Dr. Keren is requesting that Dr. Charlanne to write an order for the pt to be seen at [facility] to change out the feeding tube.  Please review and advise

## 2023-12-28 ENCOUNTER — Other Ambulatory Visit: Payer: Self-pay

## 2023-12-28 DIAGNOSIS — Z931 Gastrostomy status: Secondary | ICD-10-CM

## 2023-12-28 NOTE — Telephone Encounter (Signed)
 Pt daughter Jon  made aware of Dr. Charlanne recommendations.  Order was placed. Jon made aware.  Jon  verbalized understanding with all questions answered.

## 2023-12-28 NOTE — Telephone Encounter (Signed)
 Order placed in Epic.  Left message for pt daughter Jon  to call back.

## 2024-01-02 ENCOUNTER — Other Ambulatory Visit: Payer: Self-pay | Admitting: Gastroenterology

## 2024-01-02 ENCOUNTER — Ambulatory Visit (HOSPITAL_COMMUNITY)
Admission: RE | Admit: 2024-01-02 | Discharge: 2024-01-02 | Disposition: A | Discharge status: Home / Self Care | Source: Ambulatory Visit | Attending: Gastroenterology | Admitting: Gastroenterology

## 2024-01-02 DIAGNOSIS — Z931 Gastrostomy status: Secondary | ICD-10-CM

## 2024-01-02 DIAGNOSIS — Z431 Encounter for attention to gastrostomy: Secondary | ICD-10-CM | POA: Insufficient documentation

## 2024-01-06 DIAGNOSIS — E785 Hyperlipidemia, unspecified: Secondary | ICD-10-CM | POA: Diagnosis not present

## 2024-01-06 DIAGNOSIS — E43 Unspecified severe protein-calorie malnutrition: Secondary | ICD-10-CM | POA: Diagnosis not present

## 2024-01-06 DIAGNOSIS — G301 Alzheimer's disease with late onset: Secondary | ICD-10-CM | POA: Diagnosis not present

## 2024-01-06 DIAGNOSIS — E890 Postprocedural hypothyroidism: Secondary | ICD-10-CM | POA: Diagnosis not present

## 2024-01-06 DIAGNOSIS — J312 Chronic pharyngitis: Secondary | ICD-10-CM | POA: Diagnosis not present

## 2024-01-06 DIAGNOSIS — M3320 Polymyositis, organ involvement unspecified: Secondary | ICD-10-CM | POA: Diagnosis not present

## 2024-01-06 DIAGNOSIS — Z931 Gastrostomy status: Secondary | ICD-10-CM | POA: Diagnosis not present

## 2024-01-06 DIAGNOSIS — I10 Essential (primary) hypertension: Secondary | ICD-10-CM | POA: Diagnosis not present

## 2024-01-06 DIAGNOSIS — F0280 Dementia in other diseases classified elsewhere without behavioral disturbance: Secondary | ICD-10-CM | POA: Diagnosis not present

## 2024-01-06 DIAGNOSIS — R1319 Other dysphagia: Secondary | ICD-10-CM | POA: Diagnosis not present

## 2024-01-13 DIAGNOSIS — M3322 Polymyositis with myopathy: Secondary | ICD-10-CM | POA: Diagnosis not present

## 2024-01-13 DIAGNOSIS — H04123 Dry eye syndrome of bilateral lacrimal glands: Secondary | ICD-10-CM | POA: Diagnosis not present

## 2024-01-13 DIAGNOSIS — M5136 Other intervertebral disc degeneration, lumbar region with discogenic back pain only: Secondary | ICD-10-CM | POA: Diagnosis not present

## 2024-01-13 DIAGNOSIS — R296 Repeated falls: Secondary | ICD-10-CM | POA: Diagnosis not present

## 2024-01-13 DIAGNOSIS — R399 Unspecified symptoms and signs involving the genitourinary system: Secondary | ICD-10-CM | POA: Diagnosis not present

## 2024-01-13 DIAGNOSIS — I7300 Raynaud's syndrome without gangrene: Secondary | ICD-10-CM | POA: Diagnosis not present

## 2024-01-13 DIAGNOSIS — Z681 Body mass index (BMI) 19 or less, adult: Secondary | ICD-10-CM | POA: Diagnosis not present

## 2024-01-13 DIAGNOSIS — M1991 Primary osteoarthritis, unspecified site: Secondary | ICD-10-CM | POA: Diagnosis not present

## 2024-01-13 DIAGNOSIS — Z79899 Other long term (current) drug therapy: Secondary | ICD-10-CM | POA: Diagnosis not present

## 2024-01-13 DIAGNOSIS — R1310 Dysphagia, unspecified: Secondary | ICD-10-CM | POA: Diagnosis not present

## 2024-02-03 DIAGNOSIS — C44629 Squamous cell carcinoma of skin of left upper limb, including shoulder: Secondary | ICD-10-CM | POA: Diagnosis not present

## 2024-02-03 DIAGNOSIS — L820 Inflamed seborrheic keratosis: Secondary | ICD-10-CM | POA: Diagnosis not present

## 2024-02-03 DIAGNOSIS — D485 Neoplasm of uncertain behavior of skin: Secondary | ICD-10-CM | POA: Diagnosis not present

## 2024-02-15 ENCOUNTER — Telehealth: Payer: Self-pay

## 2024-02-15 NOTE — Telephone Encounter (Signed)
 Received fax from [facility] in regard to pt feeding tube, Fax Completed and sign by Dr. Charlanne. Fax was sent  to [phone] along with recent office notes. SABRA

## 2024-03-16 DIAGNOSIS — B079 Viral wart, unspecified: Secondary | ICD-10-CM | POA: Diagnosis not present

## 2024-04-12 DIAGNOSIS — Z23 Encounter for immunization: Secondary | ICD-10-CM | POA: Diagnosis not present

## 2024-04-12 DIAGNOSIS — E890 Postprocedural hypothyroidism: Secondary | ICD-10-CM | POA: Diagnosis not present

## 2024-04-12 DIAGNOSIS — R1319 Other dysphagia: Secondary | ICD-10-CM | POA: Diagnosis not present

## 2024-04-12 DIAGNOSIS — E43 Unspecified severe protein-calorie malnutrition: Secondary | ICD-10-CM | POA: Diagnosis not present

## 2024-04-12 DIAGNOSIS — Z9181 History of falling: Secondary | ICD-10-CM | POA: Diagnosis not present

## 2024-04-12 DIAGNOSIS — F0280 Dementia in other diseases classified elsewhere without behavioral disturbance: Secondary | ICD-10-CM | POA: Diagnosis not present

## 2024-04-12 DIAGNOSIS — Z931 Gastrostomy status: Secondary | ICD-10-CM | POA: Diagnosis not present

## 2024-04-12 DIAGNOSIS — G301 Alzheimer's disease with late onset: Secondary | ICD-10-CM | POA: Diagnosis not present

## 2024-04-12 DIAGNOSIS — E785 Hyperlipidemia, unspecified: Secondary | ICD-10-CM | POA: Diagnosis not present

## 2024-04-12 DIAGNOSIS — M3320 Polymyositis, organ involvement unspecified: Secondary | ICD-10-CM | POA: Diagnosis not present

## 2024-04-12 DIAGNOSIS — Z1331 Encounter for screening for depression: Secondary | ICD-10-CM | POA: Diagnosis not present

## 2024-04-12 DIAGNOSIS — I10 Essential (primary) hypertension: Secondary | ICD-10-CM | POA: Diagnosis not present

## 2024-04-14 DIAGNOSIS — B079 Viral wart, unspecified: Secondary | ICD-10-CM | POA: Diagnosis not present

## 2024-05-15 DIAGNOSIS — R059 Cough, unspecified: Secondary | ICD-10-CM | POA: Diagnosis not present

## 2024-07-02 ENCOUNTER — Other Ambulatory Visit (HOSPITAL_COMMUNITY)

## 2024-07-13 ENCOUNTER — Other Ambulatory Visit: Payer: Self-pay | Admitting: Gastroenterology

## 2024-07-13 ENCOUNTER — Ambulatory Visit (HOSPITAL_COMMUNITY): Admission: RE | Admit: 2024-07-13 | Source: Ambulatory Visit

## 2024-07-13 DIAGNOSIS — Z931 Gastrostomy status: Secondary | ICD-10-CM

## 2024-07-13 NOTE — Procedures (Signed)
 Patient underwent routine exchange of 20 French balloon retention gastrostomy tube today.  10 cc saline instilled into the balloon.  Tube secured to skin site and flushed without difficulty.  No immediate complications.  EBL none.

## 2025-01-11 ENCOUNTER — Other Ambulatory Visit (HOSPITAL_COMMUNITY)
